# Patient Record
Sex: Female | Born: 1970 | Race: White | Hispanic: No | Marital: Married | Smoking: Never smoker
Health system: Southern US, Community
[De-identification: ages and names within clinical notes are randomized; demographics above are authoritative.]

## PROBLEM LIST (undated history)

## (undated) HISTORY — PX: CHOLECYSTECTOMY: SHX55

---

## 1997-12-30 ENCOUNTER — Other Ambulatory Visit: Admission: RE | Admit: 1997-12-30 | Discharge: 1997-12-30 | Payer: Self-pay | Admitting: Gynecology

## 1998-01-09 ENCOUNTER — Other Ambulatory Visit: Admission: RE | Admit: 1998-01-09 | Discharge: 1998-01-09 | Payer: Self-pay | Admitting: Gynecology

## 1998-01-22 ENCOUNTER — Encounter: Admission: RE | Admit: 1998-01-22 | Discharge: 1998-04-22 | Payer: Self-pay | Admitting: Gynecology

## 1998-06-21 ENCOUNTER — Inpatient Hospital Stay (HOSPITAL_COMMUNITY): Admission: AD | Admit: 1998-06-21 | Discharge: 1998-06-25 | Payer: Self-pay | Admitting: Gynecology

## 1998-07-04 ENCOUNTER — Inpatient Hospital Stay (HOSPITAL_COMMUNITY): Admission: AD | Admit: 1998-07-04 | Discharge: 1998-07-07 | Payer: Self-pay | Admitting: Gynecology

## 1998-07-07 ENCOUNTER — Encounter (HOSPITAL_COMMUNITY): Admission: RE | Admit: 1998-07-07 | Discharge: 1998-10-05 | Payer: Self-pay | Admitting: Gynecology

## 1999-07-03 ENCOUNTER — Emergency Department (HOSPITAL_COMMUNITY): Admission: EM | Admit: 1999-07-03 | Discharge: 1999-07-03 | Payer: Self-pay | Admitting: Emergency Medicine

## 1999-08-27 ENCOUNTER — Other Ambulatory Visit: Admission: RE | Admit: 1999-08-27 | Discharge: 1999-08-27 | Payer: Self-pay | Admitting: Gynecology

## 2000-11-01 ENCOUNTER — Other Ambulatory Visit: Admission: RE | Admit: 2000-11-01 | Discharge: 2000-11-01 | Payer: Self-pay | Admitting: Gynecology

## 2001-08-09 ENCOUNTER — Encounter: Payer: Self-pay | Admitting: Emergency Medicine

## 2001-08-09 ENCOUNTER — Emergency Department (HOSPITAL_COMMUNITY): Admission: EM | Admit: 2001-08-09 | Discharge: 2001-08-09 | Payer: Self-pay | Admitting: Emergency Medicine

## 2001-08-10 ENCOUNTER — Encounter (HOSPITAL_BASED_OUTPATIENT_CLINIC_OR_DEPARTMENT_OTHER): Payer: Self-pay | Admitting: General Surgery

## 2001-08-10 ENCOUNTER — Inpatient Hospital Stay (HOSPITAL_COMMUNITY): Admission: EM | Admit: 2001-08-10 | Discharge: 2001-08-11 | Payer: Self-pay | Admitting: Emergency Medicine

## 2001-08-10 ENCOUNTER — Encounter (INDEPENDENT_AMBULATORY_CARE_PROVIDER_SITE_OTHER): Payer: Self-pay | Admitting: Specialist

## 2001-08-11 ENCOUNTER — Encounter (HOSPITAL_BASED_OUTPATIENT_CLINIC_OR_DEPARTMENT_OTHER): Payer: Self-pay | Admitting: General Surgery

## 2001-11-16 ENCOUNTER — Other Ambulatory Visit: Admission: RE | Admit: 2001-11-16 | Discharge: 2001-11-16 | Payer: Self-pay | Admitting: Gynecology

## 2002-12-21 ENCOUNTER — Other Ambulatory Visit: Admission: RE | Admit: 2002-12-21 | Discharge: 2002-12-21 | Payer: Self-pay | Admitting: Gynecology

## 2003-09-07 HISTORY — PX: TUBAL LIGATION: SHX77

## 2003-12-30 ENCOUNTER — Other Ambulatory Visit: Admission: RE | Admit: 2003-12-30 | Discharge: 2003-12-30 | Payer: Self-pay | Admitting: Gynecology

## 2004-04-10 ENCOUNTER — Encounter: Admission: RE | Admit: 2004-04-10 | Discharge: 2004-04-10 | Payer: Self-pay | Admitting: Gynecology

## 2004-06-19 ENCOUNTER — Inpatient Hospital Stay (HOSPITAL_COMMUNITY): Admission: AD | Admit: 2004-06-19 | Discharge: 2004-06-22 | Payer: Self-pay | Admitting: Gynecology

## 2004-06-23 ENCOUNTER — Encounter (INDEPENDENT_AMBULATORY_CARE_PROVIDER_SITE_OTHER): Payer: Self-pay | Admitting: Specialist

## 2004-06-23 ENCOUNTER — Inpatient Hospital Stay (HOSPITAL_COMMUNITY): Admission: AD | Admit: 2004-06-23 | Discharge: 2004-06-26 | Payer: Self-pay | Admitting: Gynecology

## 2004-06-27 ENCOUNTER — Encounter: Admission: RE | Admit: 2004-06-27 | Discharge: 2004-07-27 | Payer: Self-pay | Admitting: Gynecology

## 2004-08-07 ENCOUNTER — Other Ambulatory Visit: Admission: RE | Admit: 2004-08-07 | Discharge: 2004-08-07 | Payer: Self-pay | Admitting: Gynecology

## 2005-08-18 ENCOUNTER — Other Ambulatory Visit: Admission: RE | Admit: 2005-08-18 | Discharge: 2005-08-18 | Payer: Self-pay | Admitting: Gynecology

## 2005-11-04 HISTORY — PX: NOVASURE ABLATION: SHX5394

## 2005-12-03 ENCOUNTER — Ambulatory Visit (HOSPITAL_COMMUNITY): Admission: RE | Admit: 2005-12-03 | Discharge: 2005-12-03 | Payer: Self-pay | Admitting: Gynecology

## 2005-12-03 ENCOUNTER — Encounter (INDEPENDENT_AMBULATORY_CARE_PROVIDER_SITE_OTHER): Payer: Self-pay | Admitting: Specialist

## 2006-09-01 ENCOUNTER — Other Ambulatory Visit: Admission: RE | Admit: 2006-09-01 | Discharge: 2006-09-01 | Payer: Self-pay | Admitting: Gynecology

## 2007-09-04 ENCOUNTER — Other Ambulatory Visit: Admission: RE | Admit: 2007-09-04 | Discharge: 2007-09-04 | Payer: Self-pay | Admitting: Gynecology

## 2008-06-06 ENCOUNTER — Ambulatory Visit: Payer: Self-pay | Admitting: Gynecology

## 2008-11-27 ENCOUNTER — Ambulatory Visit: Payer: Self-pay | Admitting: Gynecology

## 2009-02-20 ENCOUNTER — Ambulatory Visit: Payer: Self-pay | Admitting: Gynecology

## 2009-02-20 ENCOUNTER — Encounter: Payer: Self-pay | Admitting: Gynecology

## 2009-02-20 ENCOUNTER — Other Ambulatory Visit: Admission: RE | Admit: 2009-02-20 | Discharge: 2009-02-20 | Payer: Self-pay | Admitting: Gynecology

## 2009-08-18 ENCOUNTER — Ambulatory Visit: Payer: Self-pay | Admitting: Gynecology

## 2010-03-03 ENCOUNTER — Ambulatory Visit: Payer: Self-pay | Admitting: Gynecology

## 2010-03-03 ENCOUNTER — Other Ambulatory Visit: Admission: RE | Admit: 2010-03-03 | Discharge: 2010-03-03 | Payer: Self-pay | Admitting: Gynecology

## 2011-01-22 NOTE — Discharge Summary (Signed)
Rachel, Miller            ACCOUNT NO.:  [id number]   MEDICAL RECORD NO.:  [id number]          PATIENT TYPE:  INP   LOCATION:  9157                          FACILITY:  [facility]   PHYSICIAN:  Juan H. Lily Peer, M.D.DATE OF BIRTH:  09/07/1970   DATE OF ADMISSION:  06/19/2004  DATE OF DISCHARGE:                                 DISCHARGE SUMMARY   Days hospitalized:  3.   HISTORY:  The patient is a 40 year old gravida 2 para 1 who was admitted to  the hospital at [redacted] weeks gestation complaining of nausea and vomiting and  some right upper quadrant pain, and some swelling.  The patient was admitted  with the intentions of administering betamethasone for fetal lung maturity  and in the event of deteriorating condition to proceed with delivery after  24 hours.  Her blood pressure was 152/93 on admission and repeat 141/83,  152/84, 149/81, 162/85, and she was afebrile.  Her admitting labs had  demonstrated white blood count of 14.8, hemoglobin 13.8, hematocrit 39.8.  Comprehensive metabolic panel:  The SGOT had risen to 71, SGPT 77, uric acid  was normal, and LDH was normal as well.  The patient had received 2 doses of  betamethasone 12.5 mg IM, and an ultrasound had demonstrated a fetus in the  frank breech presentation and size was consistent with the dates with an AFI  in the normal range for [redacted] weeks gestation.  The placenta was intact and  cervix was long at 3.4 cm.  On June 21, 2004 the patient's weight had  dropped from 194 to 190.  Her blood pressure was 120/78.  She no longer  complained of any right upper quadrant pain as when she came in, or any  nausea or vomiting.  Her PIH panel demonstrated a platelet count of 243,000.  SGPT had returned from 77 down to 49, and SGOT was normal, uric acid was  normal, LDH was also normal.  The patient was monitored 1 more day in the  hospital and she continued to be normotensive with no right upper quadrant  pain or nausea/vomiting.  Her  weight was stable as on admission 194.  Fetal  heart rate tracing was reassuring with the exception of one isolated  variable deceleration at 4 a.m. but overall reassuring.  On exam, the  patient's abdomen was soft, nontender, no rebound or guarding.  Her lungs  were clear to auscultation.  Heart was regular rate and rhythm, no murmurs  or gallops.  Extremities no edema, DTRs 1+, and negative clonus.  The  patient was ready to be discharged home and to follow up in the office twice  weekly for antepartum testing.   FINAL DIAGNOSES:  1.  Labile pregnancy-induced hypertension at 34 and three-sevenths weeks      gestation, undelivered.  2.  Gestational diabetic, diet controlled.   PROCEDURES PERFORMED:  1.  Fetal monitoring.  2.  Fetal ultrasonography.  3.  Steroid administration (betamethasone 12.5 mg x2 24 hours apart) for      fetal lung maturity.   FINAL DISPOSITION AND FOLLOW-UP:  Rachel Miller will be discharged  home today.  She will be followed up in the office twice a week for antepartum testing  consisting of NSTs and AFIs and blood pressure readings.  The patient was  asked to keep a blood pressure log at home where  her husband will be taking  her blood pressure two to three times a day and bringing those values to the  office, and ranges were presented to her in which to contact the office.  Also to do fetal movement kick counts.  In the event the patient is to start  to gain weight, swelling, or symptoms to reoccur of right upper quadrant  pain or headaches or blurry vision, that her labile PIH may turn into full-  blown preeclampsia and at that point we will ultrasound her and if she is  still breech, will proceed with a  cesarean section.  If not, will consider induction.  For now, we will wait  and try to get her at least to [redacted] weeks gestation since she is a gestational  diabetic, diet controlled.  All this was discussed with Rachel Miller and her  husband, all questions were  answered, and will follow accordingly.      JHF/MEDQ  D:  06/22/2004  T:  06/22/2004  Job:  [id number]

## 2011-01-22 NOTE — Op Note (Signed)
[facility]  Patient:    Rachel Miller, Rachel Miller Visit Number: [id number] MRN: [id number]          Service Type: SUR Location: 4W [id number] Attending Physician:  Sonda Primes Dictated by:   Mardene Celeste. Lurene Shadow, M.D. Proc. Date: 08/10/01 Admit Date:  08/10/2001 Discharge Date: 08/11/2001   CC:         Luisa Hart L. Lurene Shadow, M.D., two copies   Operative Report  PREOPERATIVE DIAGNOSIS:  Acute cholecystitis.  POSTOPERATIVE DIAGNOSIS:  Acute cholecystitis.  PROCEDURE:  Laparoscopic cholecystectomy with intraoperative cholangiogram.  SURGEON:  Luisa Hart L. Lurene Shadow, M.D.  ASSISTANT:  Marnee Spring. Wiliam Ke, M.D.  ANESTHESIA:  General.  INDICATIONS:  The patient is a 40 year old woman presenting with a several-day history of worsening right upper quadrant and epigastric pain, nausea, and vomiting. It prompted an ER visit last night where she was diagnosed to have cholelithiasis. She was afebrile and without leukocytosis at that time. However, on return to the ER today because of increasing pain, she has a noted left shift and increasing higher leukocytosis to 14,000. On examination she is exquisitely tender. She is brought to the operating room after the risks and potential benefits of laparoscopic cholecystectomy had been fully discussed with her and she accepts those risks, and gives permission.  DESCRIPTION OF PROCEDURE:  Following the induction of satisfactory anesthesia with the patient positioned supinely, the abdomen was routinely prepped and draped to be included in the sterile operative field. Open laparoscopy was created at the umbilicus with insertion of the Hasson cannula and insufflation of the peritoneal cavity to 14 mmHg pressure. Visual exploration of the abdomen showed no other abnormality except for a tensely hydropic and inflamed gallbladder. Under direct vision, the epigastric and lateral ports were placed and the gallbladder was held and  aspirating suction was placed into the gallbladder. Some of the gallbladder contents were sucked out, however, because of the thick bile within the gallbladder, this could not be completely evacuated. The gallbladder was then grasped and retracted cephalad. _______ was carried down to the region of the ampulla with isolation and visualization of the cystic duct, tracing it up into its cystic duct-gallbladder junction. This was then isolated proximally and opened. The cystic duct cholangiogram was carried out. The resultant cholangiogram showed free flow of contrast into the duodenum with no filling defects, and a normal biliary caliber. The cholangiocathter was removed, the cystic duct was doubly clipped and transected, the dissection carried up within the triangle of Calots so that the cystic artery could be isolated. This was doubly clipped and transected. A very thick peel around the gallbladder was then tediously dissected off so as to remove the gallbladder, maintaining hemostasis throughout the course of the dissection. At the end of the dissection, the liver bed was again thoroughly inspected. Additional bleeding points were treated with electrocautery. The right upper quadrant was then thoroughly irrigated with multiple aliquots of normal saline. The camera was then moved to the epigastric port of the gallbladder and placed in a laparoscopic pouch and retrieved through the umbilicus. The right upper quadrant was then again thoroughly irrigated and aspirated. Sponge, instruments, and sharp counts were verified, trocars were removed under direct vision and the wounds closed in layers as follows: The umbilical wound in two layers with 0 Dexon and 4-0 Dexon; the epigastric and lateral ports were closed with 4-0 Dexon sutures. All wounds were then reinforced with Steri-Strips and sterile dressings were applied. The anesthetic was reversed and the patient removed  from the operating room to  the recovery room in stable condition. She tolerated the procedure well. Dictated by:   Mardene Celeste. Lurene Shadow, M.D. Attending Physician:  Sonda Primes DD:  08/10/01 TD:  08/10/01 Job: [id number]

## 2011-01-22 NOTE — Discharge Summary (Signed)
NAMEARIANN, Rachel Miller            ACCOUNT NO.:  [id number]   MEDICAL RECORD NO.:  [id number]          PATIENT TYPE:  INP   LOCATION:  9109                          FACILITY:  [facility]   PHYSICIAN:  Timothy P. Fontaine, M.D.DATE OF BIRTH:  September 11, 1970   DATE OF ADMISSION:  06/23/2004  DATE OF DISCHARGE:                                 DISCHARGE SUMMARY   DISCHARGE DIAGNOSES:  1.  Pregnancy at 34 weeks.  2.  Breech presentation.  3.  Hemolysis, elevated liver function, and low platelets syndrome.  4.  Desires permanent sterilization.   PROCEDURE:  June 23, 2004 - primary low transverse cesarean section,  bilateral tubal sterilization.  Pathology [phone]:  1. Placenta:  Parenchymal infarct.  2. Fallopian tube segments:  Two segments, complete  transection, no pathologic abnormalities.   HOSPITAL COURSE:  A 40 year old G2 P1 female at 37 weeks admitted with right  upper quadrant pain, nausea, vomiting, elevated liver functions, with the  diagnosis of HELLP syndrome.  She was found to be in breech presentation and  underwent a primary cesarean section, delivering a normal female infant,  Apgars 8 and 9, weight 4 pounds 6 ounces.  Pelvic anatomy was noted to be  normal.  She underwent a bilateral tubal sterilization.  The patient's  postpartum course was uncomplicated.  She was placed on magnesium sulfate  prophylactically for 24 hours post delivery.  She did have elevations in her  SGOT/SGPT but on repeat the day of discharge these values were normalizing  and she had a normal platelet count.  She had no signs or symptoms; her  nausea, vomiting, and right upper quadrant pain had all resolved.  She was  given precautions, instructions, and follow-up.  Will be seen in the office  in 6 weeks and a prescription for Tylox #20 one to two p.o. q.4-6h. p.r.n.  pain.  Her blood type is O positive and her rubella titer was positive.     Timo   TPF/MEDQ  D:  06/26/2004  T:  06/26/2004  Job:   [id number]

## 2011-01-22 NOTE — H&P (Signed)
NAMEDELESA, KAWA            ACCOUNT NO.:  [id number]   MEDICAL RECORD NO.:  [id number]          PATIENT TYPE:  AMB   LOCATION:  SDC                           [facility]ACILITY:  [facility]   PHYSICIAN:  Timothy P. Fontaine, M.D.DATE OF BIRTH:  1970/09/12   DATE OF ADMISSION:  12/03/2005  DATE OF DISCHARGE:                                HISTORY & PHYSICAL   CHIEF COMPLAINT:  Menorrhagia.   HISTORY OF PRESENT ILLNESS:  A 40 year old, G2, P2 female with a history of  long term menorrhagia which is becoming unacceptable to her.  Outpatient  evaluation included a normal CBC with a hemoglobin of 14, normal TSH, and a  negative sonohysterogram.  She is admitted at this time for endometrial  ablation with the NovaSure technique.   PAST MEDICAL HISTORY:  Uncomplicated.   PAST SURGICAL HISTORY:  1.  Cholecystectomy.  2.  Tubal sterilization with cesarean section.   ALLERGIES:  No medications.   REVIEW OF SYSTEMS:  Noncontributory.   FAMILY HISTORY:  Noncontributory.   SOCIAL HISTORY:  Noncontributory.   ADMISSION PHYSICAL EXAMINATION:  VITAL SIGNS:  Afebrile, vital signs are  stable.  HEENT:  Normal.  LUNGS:  Clear.  CARDIAC:  Regular rate.  No rubs, murmurs, or gallops.  ABDOMEN:  Benign.  PELVIC:  External BUS, vagina normal.  Cervix normal.  Uterus normal size,  midline, and mobile.  Adnexa without masses or tenderness.   ASSESSMENT:  A 40 year old, gravida 2, para 2 female status post tubal  sterilization increasing menorrhagia which is socially unacceptable to her.  She has a normal sonohysterogram, normal thyroid study.   I have reviewed with her options for management to include endometrial  ablation.  She has read over the information and has reviewed the NovaSure  technique on-line and wants to proceed with endometrial ablation.  She  understands there are no guarantees as far as menorrhagia relief.  The  bleeding may improve but may not and she understands and accepts this.   She  understands it is machine dependent if there is a malfunction with the  equipment that we may not be able to do her ablation at that time.  She also  understands that she will need long term followup as far as routine  gynecologic care with Pap smears, endometrial carcinoma surveillance, all of  which she understands and accepts.  She also understands, although she is  status post BTL that she should never achieve pregnancy in the future, that  this could be dangerous and she understands this.  I reviewed what is  involved with the procedure, the instrumentation, expected  intraoperative/postoperative courses, the risks of infection, prolonged  antibiotics, hemorrhage necessitating transfusion, and the risk of  transfusion, the risk of uterine perforation, inadvertent injury to internal  organs including bowel, bladder, ureters, vessels, and nerves both directly  or thermally with the NovaSure instrument leading  to major reparative  surgeries and future reparative surgeries including ostomy formation, all of  which was understood and accepted.  The patient's questions were answered to  her satisfaction.  She is ready to proceed with surgery.  Timothy P. Fontaine, M.D.  Electronically Signed     TPF/MEDQ  D:  11/30/2005  T:  11/30/2005  Job:  [id number]

## 2011-01-22 NOTE — Op Note (Signed)
Rachel Miller, Rachel Miller            ACCOUNT NO.:  [id number]   MEDICAL RECORD NO.:  [id number]          PATIENT TYPE:  INP   LOCATION:  9371                          FACILITY:  [facility]   PHYSICIAN:  Timothy P. Fontaine, M.D.DATE OF BIRTH:  08/07/1971   DATE OF PROCEDURE:  06/23/2004  DATE OF DISCHARGE:                                 OPERATIVE REPORT   PREOPERATIVE DIAGNOSES:  Pregnancy at 34 weeks breech presentation, HELLP  syndrome, desires permanent sterilization, gestational diabetes.   POSTOPERATIVE DIAGNOSES:  Pregnancy at 34 weeks breech presentation, HELLP  syndrome, desires permanent sterilization, gestational diabetes.   PROCEDURE:  Primary low transverse cervical cesarean section, bilateral  tubal sterilization, modified Pomeroy technique.   SURGEON:  Timothy P. Fontaine, M.D.   ASSISTANT:  Ivor Costa. Farrel Gobble, M.D.   ANESTHESIA:  Spinal.   ESTIMATED BLOOD LOSS:  Less than 500 mL.   COMPLICATIONS:  None.   SPECIMENS:  Samples of cord blood, placenta, portions of right and portions  of left fallopian tubes. At 13:19 normal female, Apgar's 8 and 9, weight 4  pounds 6 ounces, pelvic anatomy noted to be normal. Infant found in the  frank breech presentation.   DESCRIPTION OF PROCEDURE:  The patient was taken to the operating room,  underwent spinal anesthesia and was placed in left tilt supine position,  received an abdominal preparation with Betadine solution. The bladder was  emptied with in and out indwelling Foley catheterization, placed in sterile  technique per nursing personnel. The patient was draped in the usual fashion  and after assuring adequate anesthesia the abdomen was  sharply entered  through a Pfannenstiel incision achieving adequate hemostasis at all levels.  The bladder flap was sharply and bluntly developed without difficulty. The  uterus was sharply entered and the lower uterine segment bluntly extended  laterally. The bulging membranes were ruptured,  fluid noted to be clear. The  infant was found to be in the frank breech presentation, was converted to a  footling-breech and underwent a breech extraction. The nares and mouth were  suctioned, the cord doubly clamped and cut and the infant was handed to  pediatric's in attendance. Samples of cord blood were obtained, placenta was  then spontaneously extruded, noted to be intact and was sent to pathology.  The uterus was exteriorized, endometrial cavity explored with the sponge to  remove all placental membrane fragments. The patient received 1 g Ancef  antibiotic prophylaxis at this time. The uterine incision was closed using  #0 Vicryl suture in a running interlocking stitch. Several figure-of-eight  interrupted sutures were subsequently placed to achieve ultimate hemostasis.  The left fallopian tube was then identified, traced from its insertion to  its fimbriated end, mid tubal segment grasped, elevated and doubly ligated  using #0 plain suture. The intervening segment was excised and a similar  procedure was carried out on the other side. The uterus was returned to the  abdomen which was copiously irrigated, both tubal sites reinspected showing  adequate hemostasis and the anterior fascia reapproximated using #0 Vicryl  suture in a running stitch starting at the angle and meeting  in the middle.  The subcutaneous tissues were irrigated, adequate hemostasis achieved with  electrocautery, skin reapproximated using 4-0 Vicryl in a running  subcuticular stitch.  Benzoin and Steri-Strips applied, sterile dressing  applied, patient taken to the recovery room in good condition having  tolerated the procedure well.     Timo   TPF/MEDQ  D:  06/23/2004  T:  06/23/2004  Job:  [id number]

## 2011-01-22 NOTE — H&P (Signed)
Rachel Miller, CRYDER            ACCOUNT NO.:  [id number]   MEDICAL RECORD NO.:  [id number]          PATIENT TYPE:  MAT   LOCATION:  MATC                          FACILITY:  [facility]   PHYSICIAN:  Timothy P. Fontaine, M.D.DATE OF BIRTH:  12-02-1970   DATE OF ADMISSION:  06/23/2004  DATE OF DISCHARGE:                                HISTORY & PHYSICAL   CHIEF COMPLAINT:  Pregnancy at 34 weeks, HELLP syndrome.   HISTORY OF PRESENT ILLNESS:  A 40 year old G 2, P 1 female at 66 weeks,  history of PIH, gestational diabetes, evaluated 72 hours ago complaining of  nausea, vomiting.  The patient was admitted, found to have elevated SGOT,  SGPT, liver function studies with right upper quadrant pain, nausea and  vomiting.  She was treated conservatively initially to allow for steroid  administration for fetal lung maturity.  She received 2 doses of steroids  and on repeat evaluation she became asymptomatic and her liver function  studies normalized.  The patient was discharged yesterday.  Subsequently,  last evening began with nausea, vomiting, increasing right upper quadrant  pain and represents for evaluation.  For the remainder of her history, see  her [facility].   PHYSICAL EXAMINATION:  VITAL SIGNS:  Blood pressure 156/67, vital signs are  stable.  HEENT:  Normal.  LUNGS:  Clear.  CARDIAC:  A regular rate.  No rubs, murmurs, or gallops.  ABDOMEN:  On abdominal exam, breech on Leopold's, right upper quadrant  tenderness, no rebound or acute changes.  FETAL MONITORING:  External monitor is without contractions, reassuring  fetal tracing.  PELVIC:  Closed, high, presenting part not palpable.  EXTREMITIES:  DTRs 3+, clonus one beat.   ASSESSMENT AND PLAN:  Thirty-four weeks gestation, pregnancy-induced  hypertension, gestational diabetes, recent admission with steroid  administration with nausea, vomiting, right upper quadrant pain consistent  with hemolysis, elevated liver enzymes, and low  platelet count syndrome.  Check baseline PIH labs, discuss situation with the patient and her husband.  Feel the most prudent course is to proceed with cesarean delivery given her  total picture and breech presentation.  I did review with her that I could  verify breech on ultrasound as it was verified 3 days ago but even if found  in vertex presentation I would proceed with cesarean delivery given the  significance of her symptoms and the probably long stage induction that  would be required to achieve a vaginal delivery.  Will hold on magnesium  predelivery but plan on magnesium post delivery for 24 hours at least to  reassess at that time for status.  I reviewed with them what is involved  with cesarean delivery, intraoperative/postoperative courses, the risks to  include bleeding, transfusion, infection, prolonged antibiotics, wound  complications, open and draining of incisions, closure by secondary  intention.  The risks of inadvertent injury to internal organs including  bowel, bladder, ureters, vessels, and nerves necessitating measures  exploratory reparative surgeries and future reparative  surgeries including  ostomy formation was reviewed with them.  The risk of fetal injury to  include musculoskeletal and neuroskeletal were reviewed as  well as the  issues of prematurity.  Lastly, the patient does want tubal sterilization.  She has thought about this prenatally.  I discussed with her the issues of  prematurity in the infant and whether they wanted to wait after delivery as  far as fetal status but she feels very strongly that she wants to proceed  with tubal sterilization regardless of ultimate fetal outcome.  The  permanency of the procedure as well as the risks of  failure were reviewed with her.  She understands and accepts this and wants  to proceed with surgery.  We will go ahead and draw her baseline PIH labs  now to determine their values and then ultimately proceed with  cesarean  section today.      TPF/MEDQ  D:  06/23/2004  T:  06/23/2004  Job:  [id number]

## 2011-01-22 NOTE — H&P (Signed)
Rachel Miller, Rachel Miller            ACCOUNT NO.:  [id number]   MEDICAL RECORD NO.:  [id number]          PATIENT TYPE:  MAT   LOCATION:  [facility]                          FACILITY:  [facility]   PHYSICIAN:  Rachel Miller, M.D.DATE OF BIRTH:  1971/03/31   DATE OF ADMISSION:  06/19/2004  DATE OF DISCHARGE:                                HISTORY & PHYSICAL   CHIEF COMPLAINT:  Right upper quadrant discomfort, along with nausea and  vomiting.   HISTORY:  The patient is a 40 year old, gravida 2, para 1, currently [redacted]  weeks gestation, estimated date of confinement August 02, 2004.  The  patient had been seen in the office today for a routine prenatal visit, and  was found to have PIH, and was to be followed as an outpatient, and with  antepartum testing and twice a week office visits.  She was doing well, but  at the end of the day, around 1700 hours, she had complained of some right  upper quadrant discomfort, some nausea, and had vomited several times, and  was asked to come to the hospital for further evaluation.  She had denied  any visual disturbances, or any unusual headaches.  Review of her records  indicated that she had had preterm premature rupture of membranes at 31  weeks with her last pregnancy.  She had clue cells, and was treated in the  second trimester of this pregnancy.  Earlier on, she had oligohydramnios  which eventually resolved, and she had developed gestational diabetes, which  has been followed regularly, and she was on diet control.  On arrival to the  hospital, her blood pressure was followed at 152/93, on repeat 141/83,  152/84, 149/81, 162/85, and her temperature was 98.5, and heart rate ranged  in the high 70s to mid 80s.   PAST MEDICAL HISTORY:  She denies any allergies.  Prior pregnancy was  delivered vaginally.  She had premature rupture of membranes at [redacted] weeks  gestation, and delivered at 33.[redacted] weeks gestation.  Past history of PIH, and  a history of bacterial  vaginosis with this pregnancy, and gestational  diabetes, diet controlled.  Oligohydramnios earlier in pregnancy, which  resolved.  See hospital form for additional past medical history, as well as  review of systems.   PHYSICAL EXAMINATION:  VITAL SIGNS:  Described above.  GENERAL:  The patient was alert, oriented, in no apparent acute distress.  Subjectively, she has __________.  She has had no right upper quadrant  discomfort.  HEENT:  Unremarkable.  NECK:  Supple.  Trachea midline.  No carotid bruits, no thyromegaly.  LUNGS:  Clear to auscultation without any rhonchi or wheezes.  HEART:  Regular rate and rhythm.  No murmurs or gallops.  BREAST EXAM:  Not done.  ABDOMEN:  Gravid uterus.  Breech presentation by [facility] maneuver.  PELVIC EXAM:  Not done.   LABORATORY DATA:  The patient underwent an ultrasound which demonstrated a  viable fetus in the frank breech presentation.  Amniotic fluid was in the  normal range.  Placenta was a grade 2 and anterior.  Estimated fetal weight  was  in the 75th to 90th percentile (2416 to 2687 gm).  The cervix was closed  at 3.4 cm.  PIH labs were as follows - SGOT and SGPT slightly elevated at 60  and 58 respectively.  Total bilirubin, BUN, and creatinine were normal, and  uric acid and LDH were in normal range.  CBC revealed a white count of  17,000, hemoglobin and hematocrit 14.5 and 41.2 respectively, and a platelet  count of 225,000.  Urine proteinuria trace was noted at 30 mg/dl.   ASSESSMENT:  A 40 year old, gravida 2, para 1, at [redacted] weeks gestation with  pregnancy-induced hypertension, stable.  Will attempt to get 2 doses of  betamethasone 12.5 mg IM, repeated in 24 hours, in an effort to enhance  fetal lung maturity.  Will repeat pregnancy-induced hypertension labs in 8  hours and see if there is a Trendelenburg, and continue to monitor her blood  pressures.  I have explained to [place] and her husband that if there is  any sign of  deterioration (i.e. meaning her liver function tests continue to  elevate, or if she starts having right upper quadrant pain with vomiting, or  any abnormality on the fetal heart rate tracing, or if her blood pressure  continues to be elevated), that we may not have the luxury of waiting 24  hours to get the second dose of the betamethasone, and we may need to  proceed with a cesarean section, due to the fact that the baby is in the  frank breech presentation.  They fully agree.  They have also voiced that  they would like to proceed with a bilateral tubal sterilization at the time  of cesarean section.  This had been discussed prenatally, as well.  They are  fully aware of all the above risks, benefits, and pros and cons, and all  questions were answered.  Will following according to plan as per assessment  above.      JHF/MEDQ  D:  06/20/2004  T:  06/20/2004  Job:  [id number]

## 2011-01-22 NOTE — Op Note (Signed)
NAMEFRANCIS, Rachel Miller            ACCOUNT NO.:  [id number]   MEDICAL RECORD NO.:  [id number]          PATIENT TYPE:  AMB   LOCATION:  [facility]                           FACILITY:  [facility]   PHYSICIAN:  Timothy P. Fontaine, M.D.DATE OF BIRTH:  18-May-1971   DATE OF PROCEDURE:  12/03/2005  DATE OF DISCHARGE:                                 OPERATIVE REPORT   PREOPERATIVE DIAGNOSES:  Menorrhagia.   POSTOPERATIVE DIAGNOSES:  Menorrhagia.   PROCEDURE:  Hysteroscopy, D&C, NovaSure endometrial ablation.   SURGEON:  Dr. Audie Box.   ANESTHETIC:  MAC with 1% lidocaine paracervical block.   SPECIMEN:  Endometrial curetting.   COMPLICATIONS:  None.   ESTIMATED BLOOD LOSS:  Minimal.   SORBITOL DISCREPANCY:  Reported at 0.   FINDINGS:  Uterus normal cavity, good distension, no evidence of  perforation. NovaSure settings; uterus 8.5 cm sound, cervical length 4.0,  cavity width 4, power setting 99, cautery length 1 minute 13 seconds.   PROCEDURE:  The patient was taken to the operating room, underwent MAC  anesthesia and was placed low dorsal lithotomy position, received a perineal  vaginal preparation with Betadine solution.  Bladder emptied with in-and-out  Foley catheterization per nursing personnel.  The patient was draped in  usual fashion.  EUA performed.  Cervix visualized with a speculum. Anterior  lip grasped with single-tooth tenaculum.  The uterus was sounded and  subsequent cervical length determined.  Uterus was then gently dilated to  admit the sharp curette and a gentle sharp curettage was performed.  Subsequently, the NovaSure device was placed within the cavity. Manipulation  assured proper placement. The carbon dioxide test was performed and passed  and subsequently the NovaSure endometrial ablation performed without  difficulty.  Settings noted above.  Instrument was then removed.  Hysteroscopy performed and showed an empty cavity, good distension, no  evidence of perforation  with a good even cauterization throughout the  cavity.  Instruments were removed.  A small arterial pumping bleeding was  noted at the right tenaculum site.  The 3-0 chromic suture interrupted was  placed to achieve hemostasis.  The speculum was then  removed.  The patient placed in supine position, awakened without difficulty  and taken to recovery room in good condition having tolerated procedure  well.  Of note, the paracervical block using 1% lidocaine was placed  following tenaculum placement.  A total of 10 mL.      Timothy P. Fontaine, M.D.  Electronically Signed     TPF/MEDQ  D:  12/03/2005  T:  12/06/2005  Job:  [id number]

## 2011-03-15 ENCOUNTER — Encounter (INDEPENDENT_AMBULATORY_CARE_PROVIDER_SITE_OTHER): Payer: 59 | Admitting: Gynecology

## 2011-03-15 ENCOUNTER — Other Ambulatory Visit: Payer: Self-pay | Admitting: Gynecology

## 2011-03-15 ENCOUNTER — Other Ambulatory Visit (HOSPITAL_COMMUNITY)
Admission: RE | Admit: 2011-03-15 | Discharge: 2011-03-15 | Disposition: A | Discharge status: Home / Self Care | Payer: 59 | Source: Ambulatory Visit | Attending: Gynecology | Admitting: Gynecology

## 2011-03-15 DIAGNOSIS — Z01419 Encounter for gynecological examination (general) (routine) without abnormal findings: Secondary | ICD-10-CM

## 2011-03-15 DIAGNOSIS — Z124 Encounter for screening for malignant neoplasm of cervix: Secondary | ICD-10-CM | POA: Insufficient documentation

## 2011-03-15 DIAGNOSIS — Z833 Family history of diabetes mellitus: Secondary | ICD-10-CM

## 2011-03-15 DIAGNOSIS — Z13220 Encounter for screening for lipoid disorders: Secondary | ICD-10-CM

## 2011-03-19 ENCOUNTER — Other Ambulatory Visit: Payer: 59

## 2011-03-19 ENCOUNTER — Ambulatory Visit (INDEPENDENT_AMBULATORY_CARE_PROVIDER_SITE_OTHER): Payer: 59 | Admitting: Gynecology

## 2011-03-19 DIAGNOSIS — N912 Amenorrhea, unspecified: Secondary | ICD-10-CM

## 2011-03-19 DIAGNOSIS — N949 Unspecified condition associated with female genital organs and menstrual cycle: Secondary | ICD-10-CM

## 2011-06-21 ENCOUNTER — Ambulatory Visit: Payer: 59 | Admitting: Gynecology

## 2011-06-21 ENCOUNTER — Other Ambulatory Visit: Payer: 59

## 2011-06-21 ENCOUNTER — Ambulatory Visit (INDEPENDENT_AMBULATORY_CARE_PROVIDER_SITE_OTHER): Payer: 59

## 2011-06-21 ENCOUNTER — Encounter: Payer: Self-pay | Admitting: Gynecology

## 2011-06-21 ENCOUNTER — Ambulatory Visit (INDEPENDENT_AMBULATORY_CARE_PROVIDER_SITE_OTHER): Payer: 59 | Admitting: Gynecology

## 2011-06-21 ENCOUNTER — Other Ambulatory Visit: Payer: Self-pay | Admitting: Gynecology

## 2011-06-21 DIAGNOSIS — N854 Malposition of uterus: Secondary | ICD-10-CM

## 2011-06-21 DIAGNOSIS — R102 Pelvic and perineal pain: Secondary | ICD-10-CM

## 2011-06-21 DIAGNOSIS — D251 Intramural leiomyoma of uterus: Secondary | ICD-10-CM

## 2011-06-21 DIAGNOSIS — N859 Noninflammatory disorder of uterus, unspecified: Secondary | ICD-10-CM

## 2011-06-21 DIAGNOSIS — N8300 Follicular cyst of ovary, unspecified side: Secondary | ICD-10-CM

## 2011-06-21 DIAGNOSIS — D259 Leiomyoma of uterus, unspecified: Secondary | ICD-10-CM

## 2011-06-21 DIAGNOSIS — N949 Unspecified condition associated with female genital organs and menstrual cycle: Secondary | ICD-10-CM

## 2011-06-21 NOTE — Progress Notes (Signed)
Patient presents for follow ultrasound. She has a history of monthly pelvic cramping status post endometrial ablation. She is has no menses or vaginal bleeding. She had a cystic area in the upper uterus near the fundus in July questionable as to whether this was in the cavity or in the myometrium and we've recommended a follow up ultrasound.  Ultrasound today shows several small myomas the largest measuring 44 mm with some areas of cystic degeneration. Right and left ovaries are visualized with physiologic changes.  Pelvic cramping status post ablation ultrasound overalls normal with several small myomas questional cystic degeneration. Options for management were reviewed to include expectant management versus interventional such as hysterectomy. The patient's comfortable with observation center pain really is not that bad to prefer just watch for now she'll follow up in July which is due for her annual sooner as needed.

## 2011-06-22 ENCOUNTER — Encounter: Payer: Self-pay | Admitting: Gynecology

## 2011-06-22 ENCOUNTER — Other Ambulatory Visit: Payer: Self-pay | Admitting: *Deleted

## 2011-06-22 DIAGNOSIS — Z803 Family history of malignant neoplasm of breast: Secondary | ICD-10-CM

## 2011-06-24 ENCOUNTER — Other Ambulatory Visit: Payer: Self-pay | Admitting: Gynecology

## 2011-06-24 DIAGNOSIS — Z803 Family history of malignant neoplasm of breast: Secondary | ICD-10-CM

## 2012-03-01 ENCOUNTER — Other Ambulatory Visit: Payer: Self-pay | Admitting: Gynecology

## 2012-03-01 DIAGNOSIS — D259 Leiomyoma of uterus, unspecified: Secondary | ICD-10-CM

## 2012-03-01 DIAGNOSIS — D251 Intramural leiomyoma of uterus: Secondary | ICD-10-CM

## 2012-03-15 ENCOUNTER — Ambulatory Visit: Payer: 59

## 2012-03-15 ENCOUNTER — Encounter: Payer: 59 | Admitting: Gynecology

## 2012-03-15 ENCOUNTER — Encounter: Payer: Self-pay | Admitting: Gynecology

## 2012-03-15 ENCOUNTER — Ambulatory Visit (INDEPENDENT_AMBULATORY_CARE_PROVIDER_SITE_OTHER): Payer: 59 | Admitting: Gynecology

## 2012-03-15 VITALS — BP 114/70 | Ht 62.5 in | Wt 145.0 lb

## 2012-03-15 DIAGNOSIS — Z01419 Encounter for gynecological examination (general) (routine) without abnormal findings: Secondary | ICD-10-CM

## 2012-03-15 NOTE — Patient Instructions (Signed)
Follow up in one year for annual gynecologic exam. 

## 2012-03-15 NOTE — Progress Notes (Signed)
Rachel Miller 1971-01-18 [id number]        41 y.o.  for annual exam.  Doing well.  Past medical history,surgical history, medications, allergies, family history and social history were all reviewed and documented in the EPIC chart. ROS:  Was performed and pertinent positives and negatives are included in the history.  Exam: Sherrilyn Rist assistant Blood pressure 114/70 General appearance  Normal Skin grossly normal Head/Neck normal with no cervical or supraclavicular adenopathy thyroid normal Lungs  clear Cardiac RR, without RMG Abdominal  soft, nontender, without masses, organomegaly or hernia Breasts  examined lying and sitting without masses, retractions, discharge or axillary adenopathy. Pelvic  Ext/BUS/vagina  normal   Cervix  normal   Uterus  anteverted, normal size, shape and contour, midline and mobile nontender   Adnexa  Without masses or tenderness    Anus and perineum  normal   Rectovaginal  normal sphincter tone without palpated masses or tenderness.    Assessment/Plan:  41 y.o. G2 P2 female for annual exam, tubal sterilization.    1. Status post endometrial ablation. Amenorrheic. Doing well we'll continue to monitor. 2. Mammography. Patient is due for mammogram in the fall and she knows to schedule this. SBE monthly reviewed. 3. Pap smear. Last Pap smear 2012. No Pap smear done today. I reviewed current screening guidelines we'll plan every 3-5 your screening. 4. Health maintenance. Patient had normal lipid profile, glucose, CBC and urinalysis 2012. No lab work was done today. Patient will follow up in one year assuming she continues well, sooner as needed.    Dara Lords MD, 11:22 AM 03/15/2012

## 2012-06-28 ENCOUNTER — Encounter: Payer: Self-pay | Admitting: Gynecology

## 2013-04-18 ENCOUNTER — Encounter: Payer: Self-pay | Admitting: Gynecology

## 2013-04-18 ENCOUNTER — Ambulatory Visit (INDEPENDENT_AMBULATORY_CARE_PROVIDER_SITE_OTHER): Payer: 59 | Admitting: Gynecology

## 2013-04-18 VITALS — BP 120/76 | Ht 63.0 in | Wt 151.0 lb

## 2013-04-18 DIAGNOSIS — Z01419 Encounter for gynecological examination (general) (routine) without abnormal findings: Secondary | ICD-10-CM

## 2013-04-18 DIAGNOSIS — Z13220 Encounter for screening for lipoid disorders: Secondary | ICD-10-CM

## 2013-04-18 NOTE — Patient Instructions (Signed)
Followup for annual exam in one year. Sooner if any issues. 

## 2013-04-18 NOTE — Progress Notes (Signed)
Rachel Miller 27-Apr-1971 [id number]        42 y.o.  G2P2 for annual exam.  Doing well without complaints.  Past medical history,surgical history, medications, allergies, family history and social history were all reviewed and documented in the EPIC chart.  ROS:  Performed and pertinent positives and negatives are included in the history, assessment and plan .  Exam: Kim assistant Filed Vitals:   04/18/13 1505  BP: 120/76  Height: 5\' 3"  (1.6 m)  Weight: 151 lb (68.493 kg)   General appearance  Normal Skin grossly normal Head/Neck normal with no cervical or supraclavicular adenopathy thyroid normal Lungs  clear Cardiac RR, without RMG Abdominal  soft, nontender, without masses, organomegaly or hernia Breasts  examined lying and sitting without masses, retractions, discharge or axillary adenopathy. She does have a small classic-appearing sebaceous cyst right areola at 3:00 position. Moves with the skin. Pelvic  Ext/BUS/vagina  normal   Cervix  normal   Uterus  retroverted, normal size, shape and contour, midline and mobile nontender   Adnexa  Without masses or tenderness    Anus and perineum  normal   Rectovaginal  normal sphincter tone without palpated masses or tenderness.    Assessment/Plan:  42 y.o. G2P2 female for annual exam, amenorrheic, tubal sterilization.   1. Amenorrhea. Patient remains amenorrheic since her NovaSure ablation 2007. Not having any symptoms to suggest early menopause. We'll continue to monitor. 2. Small classic-appearing sebaceous cyst right areola. Has been present for a long time. Options for management include observation versus excision discussed and she is comfortable with observation. She will report any changes. 3. Mammography 06/2012. Patient knows to repeat this coming October. SBE monthly reviewed. 4. Pap smear 2012. No Pap smear done today. No history of significant abnormal Pap smears. Plan repeat next year at 3 year interval. 5. Health  maintenance. Baseline CBC, comprehensive metabolic panel, lipid profile, urinalysis ordered. Followup one year, sooner as needed.  Note: This document was prepared with digital dictation and possible smart phrase technology. Any transcriptional errors that result from this process are unintentional.   Dara Lords MD, 3:29 PM 04/18/2013

## 2013-04-19 LAB — COMPREHENSIVE METABOLIC PANEL
AST: 14 U/L (ref 0–37)
Albumin: 5.2 g/dL (ref 3.5–5.2)
BUN: 14 mg/dL (ref 6–23)
CO2: 28 mEq/L (ref 19–32)
Calcium: 9.7 mg/dL (ref 8.4–10.5)
Chloride: 102 mEq/L (ref 96–112)
Creat: 0.71 mg/dL (ref 0.50–1.10)
Glucose, Bld: 87 mg/dL (ref 70–99)
Potassium: 4 mEq/L (ref 3.5–5.3)

## 2013-04-19 LAB — CBC WITH DIFFERENTIAL/PLATELET
Eosinophils Relative: 2 % (ref 0–5)
HCT: 39.9 % (ref 36.0–46.0)
Hemoglobin: 13.3 g/dL (ref 12.0–15.0)
Lymphocytes Relative: 28 % (ref 12–46)
Lymphs Abs: 2.5 10*3/uL (ref 0.7–4.0)
MCV: 91.3 fL (ref 78.0–100.0)
Monocytes Absolute: 0.7 10*3/uL (ref 0.1–1.0)
Monocytes Relative: 7 % (ref 3–12)
Neutro Abs: 5.6 10*3/uL (ref 1.7–7.7)
RDW: 13.7 % (ref 11.5–15.5)
WBC: 8.9 10*3/uL (ref 4.0–10.5)

## 2013-04-19 LAB — LIPID PANEL
Cholesterol: 130 mg/dL (ref 0–200)
HDL: 52 mg/dL (ref >39)
Total CHOL/HDL Ratio: 2.5 Ratio

## 2013-04-19 LAB — URINALYSIS W MICROSCOPIC + REFLEX CULTURE
Bilirubin Urine: NEGATIVE
Casts: NONE SEEN
Glucose, UA: NEGATIVE mg/dL
Hgb urine dipstick: NEGATIVE
Leukocytes, UA: NEGATIVE
Protein, ur: NEGATIVE mg/dL
Squamous Epithelial / LPF: NONE SEEN
pH: 6 (ref 5.0–8.0)

## 2013-07-02 ENCOUNTER — Encounter: Payer: Self-pay | Admitting: Gynecology

## 2013-07-03 ENCOUNTER — Other Ambulatory Visit: Payer: Self-pay | Admitting: *Deleted

## 2013-07-03 DIAGNOSIS — R928 Other abnormal and inconclusive findings on diagnostic imaging of breast: Secondary | ICD-10-CM

## 2013-07-11 ENCOUNTER — Other Ambulatory Visit: Payer: Self-pay | Admitting: *Deleted

## 2013-07-11 DIAGNOSIS — R928 Other abnormal and inconclusive findings on diagnostic imaging of breast: Secondary | ICD-10-CM

## 2013-07-12 ENCOUNTER — Other Ambulatory Visit: Payer: Self-pay

## 2013-11-13 ENCOUNTER — Ambulatory Visit (INDEPENDENT_AMBULATORY_CARE_PROVIDER_SITE_OTHER): Payer: 59 | Admitting: Gynecology

## 2013-11-13 ENCOUNTER — Encounter: Payer: Self-pay | Admitting: Gynecology

## 2013-11-13 DIAGNOSIS — N898 Other specified noninflammatory disorders of vagina: Secondary | ICD-10-CM

## 2013-11-13 DIAGNOSIS — N939 Abnormal uterine and vaginal bleeding, unspecified: Secondary | ICD-10-CM

## 2013-11-13 NOTE — Patient Instructions (Signed)
Follow up for sonohysterogram as scheduled. 

## 2013-11-13 NOTE — Progress Notes (Signed)
SHAELEE FORNI Nov 15, 1970 [id number]        43 y.o.  G2P2 presents complaining of several days of light bleeding. History of NovaSure endometrial ablation 2007 with no bleeding after that. No other symptoms such as pain breast tenderness premenstrual type feelings.  Past medical history,surgical history, problem list, medications, allergies, family history and social history were all reviewed and documented in the EPIC chart.  Exam: Kim assistant General appearance  Normal Abdomen soft without masses guarding rebound Pelvic external BUS vagina normal. Cervix normal. Uterus normal sized mobile nontender. Adnexa without masses or tenderness.  Assessment/Plan:  43 y.o. G2P2 episode of bleeding with history of NovaSure endometrial ablation 2007. We'll start with sonohysterogram to look at the endometrial cavity and plan expectant management if normal at this point.   Note: This document was prepared with digital dictation and possible smart phrase technology. Any transcriptional errors that result from this process are unintentional.   Anastasio Auerbach MD, 4:28 PM 11/13/2013

## 2013-11-14 ENCOUNTER — Other Ambulatory Visit: Payer: Self-pay | Admitting: Gynecology

## 2013-11-14 DIAGNOSIS — N939 Abnormal uterine and vaginal bleeding, unspecified: Secondary | ICD-10-CM

## 2013-11-28 ENCOUNTER — Encounter: Payer: Self-pay | Admitting: Gynecology

## 2013-11-28 ENCOUNTER — Ambulatory Visit (INDEPENDENT_AMBULATORY_CARE_PROVIDER_SITE_OTHER): Payer: 59

## 2013-11-28 ENCOUNTER — Ambulatory Visit (INDEPENDENT_AMBULATORY_CARE_PROVIDER_SITE_OTHER): Payer: 59 | Admitting: Gynecology

## 2013-11-28 ENCOUNTER — Other Ambulatory Visit: Payer: Self-pay | Admitting: Gynecology

## 2013-11-28 DIAGNOSIS — N854 Malposition of uterus: Secondary | ICD-10-CM

## 2013-11-28 DIAGNOSIS — N926 Irregular menstruation, unspecified: Secondary | ICD-10-CM

## 2013-11-28 DIAGNOSIS — D251 Intramural leiomyoma of uterus: Secondary | ICD-10-CM

## 2013-11-28 DIAGNOSIS — N8300 Follicular cyst of ovary, unspecified side: Secondary | ICD-10-CM

## 2013-11-28 DIAGNOSIS — N939 Abnormal uterine and vaginal bleeding, unspecified: Secondary | ICD-10-CM

## 2013-11-28 DIAGNOSIS — N898 Other specified noninflammatory disorders of vagina: Secondary | ICD-10-CM

## 2013-11-28 DIAGNOSIS — N831 Corpus luteum cyst of ovary, unspecified side: Secondary | ICD-10-CM

## 2013-11-28 NOTE — Progress Notes (Signed)
Rachel Miller [birth year] [id number]        43 y.o.  G2P2 presents for sonohysterogram. History of NovaSure endometrial ablation 2007. Did well until most recently when had several days of spotting. No cramping pain or other symptoms.  Past medical history,surgical history, problem list, medications, allergies, family history and social history were all reviewed and documented in the EPIC chart.  Ultrasound shows uterus normal size retroverted. Endometrial echo 4.6 mm. 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 Right left ovaries normal with physiologic changes. Cul-de-sac negative.  Sonohysterogram performed, sterile technique catheter introduced several centimeters. Subsequently anterior lip of the cervix was grasped with a single-tooth tenaculum and using a sound the endometrial cavity was attempted to be dilated/sounded. It was still only able to negotiate the lower uterine segment safely. Tenaculum was removed and the sonohysterogram catheter again replaced into the lower uterine segment as verified by the ultrasound mild distention with infusion noted with no abnormalities seen but again did not distend the mid to upper uterine cavity. Endometrial sample taken. Patient tolerated well.  Assessment/Plan:  43 y.o. G2P2 episode of light bleeding after years of amenorrhea status post NovaSure endometrial ablation. Patient will followup for biopsy results. Assuming negative for scant/and adequate then will follow bleeding at present. Will call if continues.   Note: This document was prepared with digital dictation and possible smart phrase technology. Any transcriptional errors that result from this process are unintentional.   Anastasio Auerbach MD, 3:02 PM 11/28/2013

## 2013-11-28 NOTE — Patient Instructions (Signed)
Office will call you with the biopsy result. Call if irregular bleeding continues.

## 2013-12-17 ENCOUNTER — Other Ambulatory Visit: Payer: Self-pay | Admitting: *Deleted

## 2013-12-17 DIAGNOSIS — N63 Unspecified lump in unspecified breast: Secondary | ICD-10-CM

## 2014-01-08 ENCOUNTER — Encounter: Payer: Self-pay | Admitting: Gynecology

## 2014-04-23 ENCOUNTER — Other Ambulatory Visit (HOSPITAL_COMMUNITY)
Admission: RE | Admit: 2014-04-23 | Discharge: 2014-04-23 | Disposition: A | Discharge status: Home / Self Care | Payer: 59 | Source: Ambulatory Visit | Attending: Gynecology | Admitting: Gynecology

## 2014-04-23 ENCOUNTER — Encounter: Payer: Self-pay | Admitting: Gynecology

## 2014-04-23 ENCOUNTER — Ambulatory Visit (INDEPENDENT_AMBULATORY_CARE_PROVIDER_SITE_OTHER): Payer: 59 | Admitting: Gynecology

## 2014-04-23 VITALS — BP 120/76 | Ht 63.0 in | Wt 156.0 lb

## 2014-04-23 DIAGNOSIS — Z124 Encounter for screening for malignant neoplasm of cervix: Secondary | ICD-10-CM | POA: Diagnosis present

## 2014-04-23 DIAGNOSIS — Z1151 Encounter for screening for human papillomavirus (HPV): Secondary | ICD-10-CM | POA: Diagnosis present

## 2014-04-23 DIAGNOSIS — Z01419 Encounter for gynecological examination (general) (routine) without abnormal findings: Secondary | ICD-10-CM

## 2014-04-23 NOTE — Progress Notes (Signed)
Rachel Miller 09/03/1971 [id number]        43 y.o.  G2P2 for annual exam.  Several issues noted below.  Past medical history,surgical history, problem list, medications, allergies, family history and social history were all reviewed and documented as reviewed in the EPIC chart.  ROS:  12 system ROS performed with pertinent positives and negatives included in the history, assessment and plan.   Additional significant findings :  None   Exam: Kim Counsellor Vitals:   04/23/14 1536  BP: 120/76  Height: 5\' 3"  (1.6 m)  Weight: 156 lb (70.761 kg)   General appearance:  Normal affect, orientation and appearance. Skin: Grossly normal HEENT: Without gross lesions.  No cervical or supraclavicular adenopathy. Thyroid normal.  Lungs:  Clear without wheezing, rales or rhonchi Cardiac: RR, without RMG Abdominal:  Soft, nontender, without masses, guarding, rebound, organomegaly or hernia Breasts:  Examined lying and sitting without masses, retractions, discharge or axillary adenopathy. Small classic sebaceous cyst right areola. Pelvic:  Ext/BUS/vagina normal  Cervix normal. Pap/HPV done  Uterus anteverted, normal size, shape and contour, midline and mobile nontender   Adnexa  Without masses or tenderness    Anus and perineum  Normal   Rectovaginal  Normal sphincter tone without palpated masses or tenderness.    Assessment/Plan:  43 y.o. G2P2 female for annual exam without menses, tubal sterilization.   1. Amenorrhea. Status post NovaSure endometrial ablation 2007. Did have episode of bleeding earlier this year and had a negative sonohysterogram. She's had no further bleeding. Patient without symptoms to suggest early menopause. Will continue monitoring she knows to call if any bleeding. 2. Pap smear 2012. Pap/HPV today. No history of significant abnormal Pap smears. Repeat Pap smear at 3-5 year interval assuming this Pap smear is normal per current screening guidelines. 3. Mammography  12/2013. Continue with annual mammography. SBE monthly reviewed. Has classic small sebaceous cyst right areola present for years unchanged. We'll continue to monitor. 4. Health maintenance. Patient reports having recent routine blood work done through her primary physician. No blood work done today. Followup in one year, sooner as needed.   Note: This document was prepared with digital dictation and possible smart phrase technology. Any transcriptional errors that result from this process are unintentional.   Anastasio Auerbach MD, 4:53 PM 04/23/2014

## 2014-04-23 NOTE — Patient Instructions (Signed)
Followup in one year for annual exam, sooner if any issues.  You may obtain a copy of any labs that were done today by logging onto MyChart as outlined in the instructions provided with your AVS (after visit summary). The office will not call with normal lab results but certainly if there are any significant abnormalities then we will contact you.   Health Maintenance, Female A healthy lifestyle and preventative care can promote health and wellness.  Maintain regular health, dental, and eye exams.  Eat a healthy diet. Foods like vegetables, fruits, whole grains, low-fat dairy products, and lean protein foods contain the nutrients you need without too many calories. Decrease your intake of foods high in solid fats, added sugars, and salt. Get information about a proper diet from your caregiver, if necessary.  Regular physical exercise is one of the most important things you can do for your health. Most adults should get at least 150 minutes of moderate-intensity exercise (any activity that increases your heart rate and causes you to sweat) each week. In addition, most adults need muscle-strengthening exercises on 2 or more days a week.   Maintain a healthy weight. The body mass index (BMI) is a screening tool to identify possible weight problems. It provides an estimate of body fat based on height and weight. Your caregiver can help determine your BMI, and can help you achieve or maintain a healthy weight. For adults 20 years and older:  A BMI below 18.5 is considered underweight.  A BMI of 18.5 to 24.9 is normal.  A BMI of 25 to 29.9 is considered overweight.  A BMI of 30 and above is considered obese.  Maintain normal blood lipids and cholesterol by exercising and minimizing your intake of saturated fat. Eat a balanced diet with plenty of fruits and vegetables. Blood tests for lipids and cholesterol should begin at age 20 and be repeated every 5 years. If your lipid or cholesterol levels are  high, you are over 50, or you are a high risk for heart disease, you may need your cholesterol levels checked more frequently.Ongoing high lipid and cholesterol levels should be treated with medicines if diet and exercise are not effective.  If you smoke, find out from your caregiver how to quit. If you do not use tobacco, do not start.  Lung cancer screening is recommended for adults aged 55 80 years who are at high risk for developing lung cancer because of a history of smoking. Yearly low-dose computed tomography (CT) is recommended for people who have at least a 30-pack-year history of smoking and are a current smoker or have quit within the past 15 years. A pack year of smoking is smoking an average of 1 pack of cigarettes a day for 1 year (for example: 1 pack a day for 30 years or 2 packs a day for 15 years). Yearly screening should continue until the smoker has stopped smoking for at least 15 years. Yearly screening should also be stopped for people who develop a health problem that would prevent them from having lung cancer treatment.  If you are pregnant, do not drink alcohol. If you are breastfeeding, be very cautious about drinking alcohol. If you are not pregnant and choose to drink alcohol, do not exceed 1 drink per day. One drink is considered to be 12 ounces (355 mL) of beer, 5 ounces (148 mL) of wine, or 1.5 ounces (44 mL) of liquor.  Avoid use of street drugs. Do not share needles with   anyone. Ask for help if you need support or instructions about stopping the use of drugs.  High blood pressure causes heart disease and increases the risk of stroke. Blood pressure should be checked at least every 1 to 2 years. Ongoing high blood pressure should be treated with medicines, if weight loss and exercise are not effective.  If you are 55 to 44 years old, ask your caregiver if you should take aspirin to prevent strokes.  Diabetes screening involves taking a blood sample to check your fasting  blood sugar level. This should be done once every 3 years, after age 45, if you are within normal weight and without risk factors for diabetes. Testing should be considered at a younger age or be carried out more frequently if you are overweight and have at least 1 risk factor for diabetes.  Breast cancer screening is essential preventative care for women. You should practice "breast self-awareness." This means understanding the normal appearance and feel of your breasts and may include breast self-examination. Any changes detected, no matter how small, should be reported to a caregiver. Women in their 20s and 30s should have a clinical breast exam (CBE) by a caregiver as part of a regular health exam every 1 to 3 years. After age 40, women should have a CBE every year. Starting at age 40, women should consider having a mammogram (breast X-ray) every year. Women who have a family history of breast cancer should talk to their caregiver about genetic screening. Women at a high risk of breast cancer should talk to their caregiver about having an MRI and a mammogram every year.  Breast cancer gene (BRCA)-related cancer risk assessment is recommended for women who have family members with BRCA-related cancers. BRCA-related cancers include breast, ovarian, tubal, and peritoneal cancers. Having family members with these cancers may be associated with an increased risk for harmful changes (mutations) in the breast cancer genes BRCA1 and BRCA2. Results of the assessment will determine the need for genetic counseling and BRCA1 and BRCA2 testing.  The Pap test is a screening test for cervical cancer. Women should have a Pap test starting at age 21. Between ages 21 and 29, Pap tests should be repeated every 2 years. Beginning at age 30, you should have a Pap test every 3 years as long as the past 3 Pap tests have been normal. If you had a hysterectomy for a problem that was not cancer or a condition that could lead to  cancer, then you no longer need Pap tests. If you are between ages 65 and 70, and you have had normal Pap tests going back 10 years, you no longer need Pap tests. If you have had past treatment for cervical cancer or a condition that could lead to cancer, you need Pap tests and screening for cancer for at least 20 years after your treatment. If Pap tests have been discontinued, risk factors (such as a new sexual partner) need to be reassessed to determine if screening should be resumed. Some women have medical problems that increase the chance of getting cervical cancer. In these cases, your caregiver may recommend more frequent screening and Pap tests.  The human papillomavirus (HPV) test is an additional test that may be used for cervical cancer screening. The HPV test looks for the virus that can cause the cell changes on the cervix. The cells collected during the Pap test can be tested for HPV. The HPV test could be used to screen women aged 30   years and older, and should be used in women of any age who have unclear Pap test results. After the age of 30, women should have HPV testing at the same frequency as a Pap test.  Colorectal cancer can be detected and often prevented. Most routine colorectal cancer screening begins at the age of 50 and continues through age 75. However, your caregiver may recommend screening at an earlier age if you have risk factors for colon cancer. On a yearly basis, your caregiver may provide home test kits to check for hidden blood in the stool. Use of a small camera at the end of a tube, to directly examine the colon (sigmoidoscopy or colonoscopy), can detect the earliest forms of colorectal cancer. Talk to your caregiver about this at age 50, when routine screening begins. Direct examination of the colon should be repeated every 5 to 10 years through age 75, unless early forms of pre-cancerous polyps or small growths are found.  Hepatitis C blood testing is recommended for  all people born from 1945 through 1965 and any individual with known risks for hepatitis C.  Practice safe sex. Use condoms and avoid high-risk sexual practices to reduce the spread of sexually transmitted infections (STIs). Sexually active women aged 25 and younger should be checked for Chlamydia, which is a common sexually transmitted infection. Older women with new or multiple partners should also be tested for Chlamydia. Testing for other STIs is recommended if you are sexually active and at increased risk.  Osteoporosis is a disease in which the bones lose minerals and strength with aging. This can result in serious bone fractures. The risk of osteoporosis can be identified using a bone density scan. Women ages 65 and over and women at risk for fractures or osteoporosis should discuss screening with their caregivers. Ask your caregiver whether you should be taking a calcium supplement or vitamin D to reduce the rate of osteoporosis.  Menopause can be associated with physical symptoms and risks. Hormone replacement therapy is available to decrease symptoms and risks. You should talk to your caregiver about whether hormone replacement therapy is right for you.  Use sunscreen. Apply sunscreen liberally and repeatedly throughout the day. You should seek shade when your shadow is shorter than you. Protect yourself by wearing long sleeves, pants, a wide-brimmed hat, and sunglasses year round, whenever you are outdoors.  Notify your caregiver of new moles or changes in moles, especially if there is a change in shape or color. Also notify your caregiver if a mole is larger than the size of a pencil eraser.  Stay current with your immunizations. Document Released: 03/08/2011 Document Revised: 12/18/2012 Document Reviewed: 03/08/2011 [employer] Patient Information 2014 [employer].   

## 2014-04-24 LAB — URINALYSIS W MICROSCOPIC + REFLEX CULTURE
BACTERIA UA: NONE SEEN
Bilirubin Urine: NEGATIVE
CASTS: NONE SEEN
Crystals: NONE SEEN
Glucose, UA: NEGATIVE mg/dL
HGB URINE DIPSTICK: NEGATIVE
KETONES UR: NEGATIVE mg/dL
Leukocytes, UA: NEGATIVE
NITRITE: NEGATIVE
Protein, ur: NEGATIVE mg/dL
Specific Gravity, Urine: <1.005 — ABNORMAL LOW (ref 1.005–1.030)
Squamous Epithelial / LPF: NONE SEEN
Urobilinogen, UA: 0.2 mg/dL (ref 0.0–1.0)
pH: 7 (ref 5.0–8.0)

## 2014-04-25 LAB — CYTOLOGY - PAP

## 2014-05-06 ENCOUNTER — Telehealth: Payer: Self-pay | Admitting: Gynecology

## 2014-05-06 NOTE — Telephone Encounter (Signed)
Six-month PapR Recall entered in Computer.

## 2014-05-06 NOTE — Telephone Encounter (Signed)
I called patient in followup of her most recent Pap smear. It was adequate normal with negative HPV. It did show benign endometrial cells. She is not menstruating secondary to her endometrial ablation. She did have an episode of bleeding after amenorrhea 11/2013 where sonohysterogram showed endometrial echo 4.6 mm with an avascular endometrium. Injection of saline showed lower uterine segment distention but no upper uterine distention. Endometrial sample showed no endometrial tissue but benign endocervical/squamous mucosa.  I reviewed with the patient the findings of endometrial cells and our inability to time them to her LMP. The question is how aggressive in evaluation given her picture of thinner endometrium status post endometrial ablation with an inability to further evaluate the cavity. Options to attempt a more aggressive endometrial sample versus observation discussed. She is overall a low risk having had regular albeit heavy menses preceding her ablation with a negative endometrial biopsy. After a lengthy discussion we both at this point feel comfortable with observation. She knows to call should this any further bleeding I did ask her to come back in 6 months for repeat Pap smear.

## 2014-06-06 ENCOUNTER — Ambulatory Visit: Payer: 59 | Admitting: Gynecology

## 2014-06-07 ENCOUNTER — Ambulatory Visit: Payer: 59 | Admitting: Gynecology

## 2014-07-03 ENCOUNTER — Encounter: Payer: Self-pay | Admitting: Gynecology

## 2014-07-08 ENCOUNTER — Encounter: Payer: Self-pay | Admitting: Gynecology

## 2014-10-25 ENCOUNTER — Ambulatory Visit (INDEPENDENT_AMBULATORY_CARE_PROVIDER_SITE_OTHER): Payer: 59 | Admitting: Gynecology

## 2014-10-25 ENCOUNTER — Other Ambulatory Visit (HOSPITAL_COMMUNITY)
Admission: RE | Admit: 2014-10-25 | Discharge: 2014-10-25 | Disposition: A | Discharge status: Home / Self Care | Payer: 59 | Source: Ambulatory Visit | Attending: Gynecology | Admitting: Gynecology

## 2014-10-25 ENCOUNTER — Encounter: Payer: Self-pay | Admitting: Gynecology

## 2014-10-25 DIAGNOSIS — Z01419 Encounter for gynecological examination (general) (routine) without abnormal findings: Secondary | ICD-10-CM | POA: Diagnosis not present

## 2014-10-25 DIAGNOSIS — R8789 Other abnormal findings in specimens from female genital organs: Secondary | ICD-10-CM

## 2014-10-25 DIAGNOSIS — R87618 Other abnormal cytological findings on specimens from cervix uteri: Secondary | ICD-10-CM

## 2014-10-25 NOTE — Progress Notes (Signed)
Rachel Miller April 10, 1971 [id number]        44 y.o.  G2P2 presents for follow up Pap smear.  History of recent annual exam 04/2014 where her Pap smear showed benign-appearing endometrial cells with no squamous atypia and negative HPV. History of endometrial ablation without regular menses. Does note occasional spotting which I think attributed for the cells.  She did have a subtle histogram due to an episode of bleeding 11/2013. Endometrial echo was 4.6 mm. Attempt at distention of the uterine cavity was unsuccessful with only the lower uterine segment distending. An endometrial sample showed no endometrial tissue but benign endocervical and squamous mucosa. We discussed over the telephone and called we decided to repeat Pap smear in 6 months. She has had several episodes of spotting since then but no heavy bleeding.  Past medical history,surgical history, problem list, medications, allergies, family history and social history were all reviewed and documented in the EPIC chart.  Directed ROS with pertinent positives and negatives documented in the history of present illness/assessment and plan.  Exam: Kim assistant General appearance:  Normal External BUS vagina normal. Cervix normal. Pap smear done. Uterus normal size midline mobile nontender. Adnexa without masses or tenderness.  Assessment/Plan:  44 y.o. G2P2 with history as above. Patient will follow for Pap smear and assuming negative and follow expectantly with follow up in 6 months when she is due for her annual exam.     Anastasio Auerbach MD, 4:22 PM 10/25/2014

## 2014-10-25 NOTE — Patient Instructions (Signed)
Follow up if any issues. Office will call you with Pap smear results within the week.

## 2014-10-25 NOTE — Addendum Note (Signed)
Addended by: Nelva Nay on: 10/25/2014 04:31 PM   Modules accepted: Orders

## 2014-10-29 LAB — CYTOLOGY - PAP

## 2014-11-06 ENCOUNTER — Telehealth: Payer: Self-pay | Admitting: *Deleted

## 2014-11-06 NOTE — Telephone Encounter (Signed)
Pt called requesting pap results on 10/25/14, left on voicemail results normal

## 2015-04-12 ENCOUNTER — Emergency Department (HOSPITAL_BASED_OUTPATIENT_CLINIC_OR_DEPARTMENT_OTHER)
Admission: EM | Admit: 2015-04-12 | Discharge: 2015-04-13 | Disposition: A | Discharge status: Home / Self Care | Payer: 59 | Attending: Emergency Medicine | Admitting: Emergency Medicine

## 2015-04-12 ENCOUNTER — Encounter (HOSPITAL_BASED_OUTPATIENT_CLINIC_OR_DEPARTMENT_OTHER): Payer: Self-pay | Admitting: Emergency Medicine

## 2015-04-12 ENCOUNTER — Emergency Department (HOSPITAL_BASED_OUTPATIENT_CLINIC_OR_DEPARTMENT_OTHER): Payer: 59

## 2015-04-12 DIAGNOSIS — R079 Chest pain, unspecified: Secondary | ICD-10-CM | POA: Insufficient documentation

## 2015-04-12 LAB — BASIC METABOLIC PANEL
Anion gap: 8 (ref 5–15)
BUN: 13 mg/dL (ref 6–20)
CO2: 28 mmol/L (ref 22–32)
CREATININE: 0.64 mg/dL (ref 0.44–1.00)
Calcium: 9.5 mg/dL (ref 8.9–10.3)
Chloride: 104 mmol/L (ref 101–111)
Glucose, Bld: 98 mg/dL (ref 65–99)
Potassium: 3.5 mmol/L (ref 3.5–5.1)
SODIUM: 140 mmol/L (ref 135–145)

## 2015-04-12 LAB — CBC
HCT: 39.4 % (ref 36.0–46.0)
Hemoglobin: 13.3 g/dL (ref 12.0–15.0)
MCH: 31.2 pg (ref 26.0–34.0)
MCHC: 33.8 g/dL (ref 30.0–36.0)
MCV: 92.5 fL (ref 78.0–100.0)
Platelets: 341 10*3/uL (ref 150–400)
RBC: 4.26 MIL/uL (ref 3.87–5.11)
RDW: 12.5 % (ref 11.5–15.5)
WBC: 8 10*3/uL (ref 4.0–10.5)

## 2015-04-12 LAB — TROPONIN I: Troponin I: <0.03 ng/mL (ref <0.031)

## 2015-04-12 NOTE — ED Provider Notes (Signed)
CSN: [id number]   Arrival date & time 04/12/15 2232  History  This chart was scribed for  Veryl Speak, MD by Altamease Oiler, ED Scribe. This patient was seen in room MH06/MH06 and the patient's care was started at 11:41 PM.  Chief Complaint  Patient presents with  . Chest Pain    HPI The history is provided by the patient. No language interpreter was used.   Rachel Miller is a 44 y.o. female who presents to the Emergency Department complaining of intermittent central chest pain with gradual onset 5 days ago. The pain is described as tightness that is worse with sitting upright. She rates the pain 4/10 in severity and notes that it is not exertional or affected by food.  No sweating, SOB, cough,  or nausea. No personal history of heart disease, DM, HTN, or HLD. Family history of MI. Denies tobacco use. Runs near daily without issue.   History reviewed. No pertinent past medical history.  Past Surgical History  Procedure Laterality Date  . Cesarean section  2005  . Tubal ligation  2005  . Novasure ablation  11/2005  . Cholecystectomy      Family History  Problem Relation Age of Onset  . Ovarian cancer Maternal Aunt   . Heart disease Father   . Heart attack Mother     History  Substance Use Topics  . Smoking status: Never Smoker   . Smokeless tobacco: Never Used  . Alcohol Use: Yes     Comment: social     Review of Systems 10 Systems reviewed and all are negative for acute change except as noted in the HPI.  Home Medications   Prior to Admission medications   Not on File    Allergies  Review of patient's allergies indicates no known allergies.  Triage Vitals: BP 157/85 mmHg  Pulse 62  Temp(Src) 98 F (36.7 C) (Oral)  Resp 20  Ht 5\' 2"  (1.575 m)  Wt 150 lb (68.04 kg)  BMI 27.43 kg/m2  SpO2 100%  Physical Exam  Constitutional: She is oriented to person, place, and time. She appears well-developed and well-nourished. No distress.  HENT:  Head: Normocephalic  and atraumatic.  Eyes: EOM are normal. Pupils are equal, round, and reactive to light.  Neck: Normal range of motion.  Cardiovascular: Normal rate, regular rhythm and normal heart sounds.   Pulmonary/Chest: Effort normal and breath sounds normal.  Abdominal: Soft. She exhibits no distension. There is no tenderness.  Musculoskeletal: Normal range of motion.  Neurological: She is alert and oriented to person, place, and time.  Skin: Skin is warm and dry.  Psychiatric: She has a normal mood and affect. Judgment normal.  Nursing note and vitals reviewed.   ED Course  Procedures   DIAGNOSTIC STUDIES: Oxygen Saturation is 100% on RA, normal by my interpretation.    COORDINATION OF CARE: 11:46 PM Discussed treatment plan which includes CXR, EKG, and lab work with pt at bedside and pt agreed to plan.  Labs Review-  Labs Reviewed  BASIC METABOLIC PANEL  CBC  TROPONIN I    Imaging Review No results found.  ED ECG REPORT   Date: 04/12/2015  Rate: 73  Rhythm: normal sinus rhythm  QRS Axis: normal  Intervals: normal  ST/T Wave abnormalities: normal  Conduction Disutrbances:none  Narrative Interpretation:   Old EKG Reviewed: none available  I have personally reviewed the EKG tracing and agree with the computerized printout as noted.  MDM   Final diagnoses:  None     Patient presents with complaints of tightness across the front of her chest for the past 5 days. There is no associated shortness of breath, nausea, or diaphoresis. She has no exertional component and does report that she runs every day without symptoms. Her workup reveals negative troponin, normal laboratory studies, normal EKG, and essentially clear chest x-ray.  Her symptoms do not sound cardiac, she exercises daily with no symptoms, and today's workup is unremarkable. I believe she is appropriate for discharge. She will be given the number for the cardiology clinic to call to arrange a follow-up  appointment if she is not improving in the next 2 days. She understands to return if symptoms worsen or change.  I personally performed the services described in this documentation, which was scribed in my presence. The recorded information has been reviewed and is accurate.      Veryl Speak, MD 04/13/15 [phone]

## 2015-04-12 NOTE — ED Notes (Signed)
Pt in c/o chest tightness onset x 5 days ago. Has progressively gotten worse with radiation to L arm and neck. Denies SOB, NAD at this time.

## 2015-04-13 NOTE — Discharge Instructions (Signed)
Follow-up with the cardiology clinic if not improving in the next 2 days. The contact information for the [facility] cardiology office has been provided in this discharge summary. Call on Monday to arrange this appointment.  Return to the ER if symptoms significantly worsen or change.   Chest Pain (Nonspecific) It is often hard to give a specific diagnosis for the cause of chest pain. There is always a chance that your pain could be related to something serious, such as a heart attack or a blood clot in the lungs. You need to follow up with your health care provider for further evaluation. CAUSES   Heartburn.  Pneumonia or bronchitis.  Anxiety or stress.  Inflammation around your heart (pericarditis) or lung (pleuritis or pleurisy).  A blood clot in the lung.  A collapsed lung (pneumothorax). It can develop suddenly on its own (spontaneous pneumothorax) or from trauma to the chest.  Shingles infection (herpes zoster virus). The chest wall is composed of bones, muscles, and cartilage. Any of these can be the source of the pain.  The bones can be bruised by injury.  The muscles or cartilage can be strained by coughing or overwork.  The cartilage can be affected by inflammation and become sore (costochondritis). DIAGNOSIS  Lab tests or other studies may be needed to find the cause of your pain. Your health care provider may have you take a test called an ambulatory electrocardiogram (ECG). An ECG records your heartbeat patterns over a 24-hour period. You may also have other tests, such as:  Transthoracic echocardiogram (TTE). During echocardiography, sound waves are used to evaluate how blood flows through your heart.  Transesophageal echocardiogram (TEE).  Cardiac monitoring. This allows your health care provider to monitor your heart rate and rhythm in real time.  Holter monitor. This is a portable device that records your heartbeat and can help diagnose heart arrhythmias. It  allows your health care provider to track your heart activity for several days, if needed.  Stress tests by exercise or by giving medicine that makes the heart beat faster. TREATMENT   Treatment depends on what may be causing your chest pain. Treatment may include:  Acid blockers for heartburn.  Anti-inflammatory medicine.  Pain medicine for inflammatory conditions.  Antibiotics if an infection is present.  You may be advised to change lifestyle habits. This includes stopping smoking and avoiding alcohol, caffeine, and chocolate.  You may be advised to keep your head raised (elevated) when sleeping. This reduces the chance of acid going backward from your stomach into your esophagus. Most of the time, nonspecific chest pain will improve within 2-3 days with rest and mild pain medicine.  HOME CARE INSTRUCTIONS   If antibiotics were prescribed, take them as directed. Finish them even if you start to feel better.  For the next few days, avoid physical activities that bring on chest pain. Continue physical activities as directed.  Do not use any tobacco products, including cigarettes, chewing tobacco, or electronic cigarettes.  Avoid drinking alcohol.  Only take medicine as directed by your health care provider.  Follow your health care provider's suggestions for further testing if your chest pain does not go away.  Keep any follow-up appointments you made. If you do not go to an appointment, you could develop lasting (chronic) problems with pain. If there is any problem keeping an appointment, call to reschedule. SEEK MEDICAL CARE IF:   Your chest pain does not go away, even after treatment.  You have a rash  with blisters on your chest.  You have a fever. SEEK IMMEDIATE MEDICAL CARE IF:   You have increased chest pain or pain that spreads to your arm, neck, jaw, back, or abdomen.  You have shortness of breath.  You have an increasing cough, or you cough up blood.  You  have severe back or abdominal pain.  You feel nauseous or vomit.  You have severe weakness.  You faint.  You have chills. This is an emergency. Do not wait to see if the pain will go away. Get medical help at once. Call your local emergency services (911 in [place] Do not drive yourself to the hospital. MAKE SURE YOU:   Understand these instructions.  Will watch your condition.  Will get help right away if you are not doing well or get worse. Document Released: 06/02/2005 Document Revised: 08/28/2013 Document Reviewed: 03/28/2008 [facility] Patient Information 2015 [place], [place]. This information is not intended to replace advice given to you by your health care provider. Make sure you discuss any questions you have with your health care provider.

## 2015-04-18 ENCOUNTER — Encounter: Payer: Self-pay | Admitting: Cardiology

## 2015-04-18 ENCOUNTER — Ambulatory Visit (INDEPENDENT_AMBULATORY_CARE_PROVIDER_SITE_OTHER): Payer: 59 | Admitting: Cardiology

## 2015-04-18 ENCOUNTER — Ambulatory Visit (INDEPENDENT_AMBULATORY_CARE_PROVIDER_SITE_OTHER)
Admission: RE | Admit: 2015-04-18 | Discharge: 2015-04-18 | Disposition: A | Discharge status: Home / Self Care | Payer: 59 | Source: Ambulatory Visit | Attending: Cardiology | Admitting: Cardiology

## 2015-04-18 VITALS — BP 118/76 | HR 84 | Wt 151.2 lb

## 2015-04-18 DIAGNOSIS — R0789 Other chest pain: Secondary | ICD-10-CM | POA: Diagnosis not present

## 2015-04-18 DIAGNOSIS — R072 Precordial pain: Secondary | ICD-10-CM | POA: Diagnosis not present

## 2015-04-18 NOTE — Progress Notes (Signed)
Cardiology Office Note   Date:  04/18/2015   ID:  Rachel Miller, DOB May 04, 1971, MRN [id number]  PCP:  Tereasa Coop, PA-C  Cardiologist:   Minus Breeding, MD   Chief Complaint  Patient presents with  . Chest Pain      History of Present Illness: Rachel Miller is a 44 y.o. female who presents for evaluation of chest discomfort. She has no past cardiac history. However, dramatic family history with her father having had transplant after heart disease in his 4s (she did not know him growing up and so doesn't know the details). Her mother died suddenly of an infarct in her mid 8s. The patient exercises. She noticed over the last week or so that she was having some increasing symptoms when she was at rest. She would have some chest tightness through her chest. It was mild to moderate. There was no radiation. This was on and off for 5 or 6 days. She finally presented to the emergency room where she was not noted to have any EKG changes and enzymes were negative. I reviewed these records. Of note her discomfort was somewhat slightly positional or with deep inspiration. It would come and go spontaneously. She has not had discomfort like this before. She was able to run a few days ago and didn't feel quite as good afterwards but didn't have any discomfort during the study. She's not having any palpitations, presyncope or syncope. She's not having any PND or orthopnea.  No past medical history on file.  Past Surgical History  Procedure Laterality Date  . Cesarean section  2005  . Tubal ligation  2005  . Novasure ablation  11/2005  . Cholecystectomy       Current Outpatient Prescriptions  Medication Sig Dispense Refill  . albuterol (PROAIR HFA) 108 (90 BASE) MCG/ACT inhaler Inhale 2 puffs into the lungs as needed.     No current facility-administered medications for this visit.    Allergies:   Review of patient's allergies indicates no known allergies.    Social History:   The patient  reports that she has never smoked. She has never used smokeless tobacco. She reports that she drinks alcohol. She reports that she does not use illicit drugs.   Family History:  The patient's family history includes Heart attack (age of onset: 2) in her mother; Heart disease (age of onset: 13) in her father; Ovarian cancer in her maternal aunt.    ROS:  Please see the history of present illness.   Otherwise, review of systems are positive for none.   All other systems are reviewed and negative.    PHYSICAL EXAM: VS:  BP 118/76 mmHg  Pulse 84  Wt 151 lb 3.2 oz (68.584 kg) , BMI Body mass index is 27.65 kg/(m^2). GENERAL:  Well appearing HEENT:  Pupils equal round and reactive, fundi not visualized, oral mucosa unremarkable NECK:  No jugular venous distention, waveform within normal limits, carotid upstroke brisk and symmetric, no bruits, no thyromegaly LYMPHATICS:  No cervical, inguinal adenopathy LUNGS:  Clear to auscultation bilaterally BACK:  No CVA tenderness CHEST:  Unremarkable HEART:  PMI not displaced or sustained,S1 and S2 within normal limits, no S3, no S4, no clicks, no rubs, no murmurs ABD:  Flat, positive bowel sounds normal in frequency in pitch, no bruits, no rebound, no guarding, no midline pulsatile mass, no hepatomegaly, no splenomegaly EXT:  2 plus pulses throughout, no edema, no cyanosis no clubbing SKIN:  No rashes  no nodules NEURO:  Cranial nerves II through XII grossly intact, motor grossly intact throughout PSYCH:  Cognitively intact, oriented to person place and time    EKG:  EKG is not ordered today. The ekg ordered 8/6 demonstrates NSR, rate 73, axis WNL, intervals WNL, no acute ST T wave changes.    Recent Labs: 04/12/2015: BUN 13; Creatinine, Ser 0.64; Hemoglobin 13.3; Platelets 341; Potassium 3.5; Sodium 140    Lipid Panel    Component Value Date/Time   CHOL 130 04/18/2013 1531   TRIG 78 04/18/2013 1531   HDL 52 04/18/2013 1531    CHOLHDL 2.5 04/18/2013 1531   VLDL 16 04/18/2013 1531   LDLCALC 62 04/18/2013 1531      Wt Readings from Last 3 Encounters:  04/18/15 151 lb 3.2 oz (68.584 kg)  04/12/15 150 lb (68.04 kg)  04/23/14 156 lb (70.761 kg)      Other studies Reviewed: Additional studies/ records that were reviewed today include: ED records. Review of the above records demonstrates:  Please see elsewhere in the note.     ASSESSMENT AND PLAN:  CHEST PAIN:  Chest pain is atypical. However, she has a significant family history. Given this stress testing is indicated. In addition for risk stratification and to guide [place] in primary risk reduction and follow-up calcium score would be indicated. I will order a POET (Plain Old Exercise Treadmill).     Current medicines are reviewed at length with the patient today.  The patient does not have concerns regarding medicines.  The following changes have been made:  no change  Labs/ tests ordered today include:   Orders Placed This Encounter  Procedures  . CT Cardiac Scoring  . Exercise Tolerance Test     Disposition:   FU with me as needed.      Signed, Minus Breeding, MD  04/18/2015 9:35 AM    [place]

## 2015-04-18 NOTE — Patient Instructions (Signed)
Your physician recommends that you schedule a follow-up appointment As Needed  Your physician has requested that you have an exercise tolerance test. For further information please visit [email or web address]. Please also follow instruction sheet, as given.  Your physician has requested that you have a CT Calcium Score

## 2015-04-21 ENCOUNTER — Ambulatory Visit (HOSPITAL_COMMUNITY)
Admission: RE | Admit: 2015-04-21 | Discharge: 2015-04-21 | Disposition: A | Discharge status: Home / Self Care | Payer: 59 | Source: Ambulatory Visit | Attending: Cardiology | Admitting: Cardiology

## 2015-04-21 DIAGNOSIS — R0789 Other chest pain: Secondary | ICD-10-CM | POA: Insufficient documentation

## 2015-04-21 LAB — EXERCISE TOLERANCE TEST
CSEPHR: 104 %
CSEPPHR: 184 {beats}/min
Estimated workload: 15.3 METS
Exercise duration (min): 13 min
Exercise duration (sec): 0 s
MPHR: 176 {beats}/min
RPE: 15
Rest HR: 65 {beats}/min

## 2015-04-21 NOTE — Progress Notes (Signed)
ETT completed.  See note

## 2015-04-25 ENCOUNTER — Ambulatory Visit (INDEPENDENT_AMBULATORY_CARE_PROVIDER_SITE_OTHER): Payer: 59 | Admitting: Gynecology

## 2015-04-25 ENCOUNTER — Other Ambulatory Visit (HOSPITAL_COMMUNITY)
Admission: RE | Admit: 2015-04-25 | Discharge: 2015-04-25 | Disposition: A | Discharge status: Home / Self Care | Payer: 59 | Source: Ambulatory Visit | Attending: Gynecology | Admitting: Gynecology

## 2015-04-25 ENCOUNTER — Encounter: Payer: Self-pay | Admitting: Gynecology

## 2015-04-25 VITALS — BP 120/76 | Ht 63.0 in | Wt 151.0 lb

## 2015-04-25 DIAGNOSIS — Z01419 Encounter for gynecological examination (general) (routine) without abnormal findings: Secondary | ICD-10-CM

## 2015-04-25 NOTE — Patient Instructions (Signed)

## 2015-04-25 NOTE — Progress Notes (Signed)
MELINA MOSTELLER April 18, 1971 [id number]        44 y.o.  G2P2 for annual exam.  Doing well without complaints.  Past medical history,surgical history, problem list, medications, allergies, family history and social history were all reviewed and documented as reviewed in the EPIC chart.  ROS:  Performed with pertinent positives and negatives included in the history, assessment and plan.   Additional significant findings :  none   Exam: Kim Counsellor Vitals:   04/25/15 1123  BP: 120/76  Height: 5\' 3"  (1.6 m)  Weight: 151 lb (68.493 kg)   General appearance:  Normal affect, orientation and appearance. Skin: Grossly normal HEENT: Without gross lesions.  No cervical or supraclavicular adenopathy. Thyroid normal.  Lungs:  Clear without wheezing, rales or rhonchi Cardiac: RR, without RMG Abdominal:  Soft, nontender, without masses, guarding, rebound, organomegaly or hernia Breasts:  Examined lying and sitting without masses, retractions, discharge or axillary adenopathy. Pelvic:  Ext/BUS/vagina normal  Cervix normal. Pap smear done  Uterus anteverted, normal size, shape and contour, midline and mobile nontender   Adnexa  Without masses or tenderness    Anus and perineum  Normal   Rectovaginal  Normal sphincter tone without palpated masses or tenderness.    Assessment/Plan:  44 y.o. G2P2 female for annual exam without menses, status post endometrial ablation, tubal sterilization.   1. Having no bleeding. Patient was having some spotting last year but this is resolved. No symptoms to suggest early menopause. Continue to monitor. 2. Pap smear 10/2014. Pap smear last year showed benign-appearing endometrial cells. Was attributed to her spotting. Sonohysterogram showed thickened endometrium although unable to distend the cavity due to scarring from her ablation.  Follow up Pap smear was negative. Pap smear done today. 3. Mammography 06/2014. Continue annual mammography when due. SBE  monthly reviewed. 4. Health maintenance. No routine blood work done as this is done through her primary 20 office as well as recently done in the ER where she presented for chest pain with a negative workup. Follow up in one year, sooner as needed.   Anastasio Auerbach MD, 11:46 AM 04/25/2015

## 2015-04-25 NOTE — Addendum Note (Signed)
Addended by: Nelva Nay on: 04/25/2015 12:02 PM   Modules accepted: Orders

## 2015-04-26 LAB — URINALYSIS W MICROSCOPIC + REFLEX CULTURE
Bacteria, UA: NONE SEEN [HPF]
Bilirubin Urine: NEGATIVE
CASTS: NONE SEEN [LPF]
Crystals: NONE SEEN [HPF]
Glucose, UA: NEGATIVE
Hgb urine dipstick: NEGATIVE
Ketones, ur: NEGATIVE
Leukocytes, UA: NEGATIVE
Nitrite: NEGATIVE
Protein, ur: NEGATIVE
RBC / HPF: NONE SEEN RBC/HPF (ref <=2)
SPECIFIC GRAVITY, URINE: 1.006 (ref 1.001–1.035)
Squamous Epithelial / LPF: NONE SEEN [HPF] (ref <=5)
WBC UA: NONE SEEN WBC/HPF (ref <=5)
Yeast: NONE SEEN [HPF]
pH: 7.5 (ref 5.0–8.0)

## 2015-04-29 LAB — CYTOLOGY - PAP

## 2015-07-04 ENCOUNTER — Encounter: Payer: Self-pay | Admitting: Gynecology

## 2016-04-26 ENCOUNTER — Encounter: Payer: 59 | Admitting: Gynecology

## 2016-05-13 ENCOUNTER — Ambulatory Visit (INDEPENDENT_AMBULATORY_CARE_PROVIDER_SITE_OTHER): Payer: 59 | Admitting: Gynecology

## 2016-05-13 ENCOUNTER — Encounter: Payer: Self-pay | Admitting: Gynecology

## 2016-05-13 VITALS — BP 120/76 | Ht 64.0 in | Wt 154.0 lb

## 2016-05-13 DIAGNOSIS — Z13220 Encounter for screening for lipoid disorders: Secondary | ICD-10-CM | POA: Diagnosis not present

## 2016-05-13 DIAGNOSIS — Z01419 Encounter for gynecological examination (general) (routine) without abnormal findings: Secondary | ICD-10-CM | POA: Diagnosis not present

## 2016-05-13 LAB — COMPREHENSIVE METABOLIC PANEL
ALK PHOS: 47 U/L (ref 33–115)
ALT: 14 U/L (ref 6–29)
AST: 17 U/L (ref 10–35)
Albumin: 4.6 g/dL (ref 3.6–5.1)
BILIRUBIN TOTAL: 0.3 mg/dL (ref 0.2–1.2)
BUN: 11 mg/dL (ref 7–25)
CALCIUM: 9.1 mg/dL (ref 8.6–10.2)
CO2: 25 mmol/L (ref 20–31)
Chloride: 106 mmol/L (ref 98–110)
Creat: 0.63 mg/dL (ref 0.50–1.10)
Glucose, Bld: 86 mg/dL (ref 65–99)
POTASSIUM: 3.9 mmol/L (ref 3.5–5.3)
Sodium: 136 mmol/L (ref 135–146)
TOTAL PROTEIN: 6.9 g/dL (ref 6.1–8.1)

## 2016-05-13 LAB — CBC WITH DIFFERENTIAL/PLATELET
Basophils Absolute: 0 cells/uL (ref 0–200)
Basophils Relative: 0 %
EOS ABS: 162 {cells}/uL (ref 15–500)
Eosinophils Relative: 2 %
HEMATOCRIT: 39.7 % (ref 35.0–45.0)
HEMOGLOBIN: 13.3 g/dL (ref 11.7–15.5)
LYMPHS ABS: 2673 {cells}/uL (ref 850–3900)
Lymphocytes Relative: 33 %
MCH: 30.6 pg (ref 27.0–33.0)
MCHC: 33.5 g/dL (ref 32.0–36.0)
MCV: 91.5 fL (ref 80.0–100.0)
MONO ABS: 405 {cells}/uL (ref 200–950)
MPV: 10.2 fL (ref 7.5–12.5)
Monocytes Relative: 5 %
NEUTROS ABS: 4860 {cells}/uL (ref 1500–7800)
Neutrophils Relative %: 60 %
Platelets: 392 10*3/uL (ref 140–400)
RBC: 4.34 MIL/uL (ref 3.80–5.10)
RDW: 13.3 % (ref 11.0–15.0)
WBC: 8.1 10*3/uL (ref 3.8–10.8)

## 2016-05-13 LAB — LIPID PANEL
Cholesterol: 162 mg/dL (ref 125–200)
HDL: 67 mg/dL (ref >=46)
LDL Cholesterol: 82 mg/dL (ref <130)
Total CHOL/HDL Ratio: 2.4 Ratio (ref <=5.0)
Triglycerides: 67 mg/dL (ref <150)
VLDL: 13 mg/dL (ref <30)

## 2016-05-13 NOTE — Progress Notes (Signed)
    Rachel Miller [birth year] [id number]        45 y.o.  G2P2  for annual exam.  Doing well without complaints  Past medical history,surgical history, problem list, medications, allergies, family history and social history were all reviewed and documented as reviewed in the EPIC chart.  ROS:  Performed with pertinent positives and negatives included in the history, assessment and plan.   Additional significant findings :  None   Exam: Caryn Bee assistant Vitals:   05/13/16 1559  BP: 120/76  Weight: 154 lb (69.9 kg)  Height: 5\' 4"  (1.626 m)   Body mass index is 26.43 kg/m.  General appearance:  Normal affect, orientation and appearance. Skin: Grossly normal HEENT: Without gross lesions.  No cervical or supraclavicular adenopathy. Thyroid normal.  Lungs:  Clear without wheezing, rales or rhonchi Cardiac: RR, without RMG Abdominal:  Soft, nontender, without masses, guarding, rebound, organomegaly or hernia Breasts:  Examined lying and sitting without masses, retractions, discharge or axillary adenopathy. Pelvic:  Ext/BUS/Vagina normal  Cervix normal  Uterus anteverted, normal size, shape and contour, midline and mobile nontender   Adnexa without masses or tenderness    Anus and perineum normal   Rectovaginal normal sphincter tone without palpated masses or tenderness.    Assessment/Plan:  45 y.o. G2P2 female for annual exam without menses, tubal sterilization.   1. Amenorrhea. Status post endometrial ablation. Was having some spotting urine a half ago. Had negative sonohysterogram.  And she has done no further spotting or any bleeding. No symptoms to suggest menopause. Continue to monitor. 2. Pap smear 04/2015. No Pap smear done today. Did have Pap smear 2015 which showed benign-appearing endometrial cells which was thought due to her spotting at that time. Follow up Pap smears have been negative. Plan repeat Pap smear at 3 year interval per current screening  guidelines. 3. Mammography coming due end of October/November and I reminded her to schedule this. SBE monthly reviewed. 4. Health maintenance. Requests baseline labs. CBC, CMP, lipid profile, urinalysis ordered. Follow up 1 year, sooner as needed.Anastasio Auerbach MD, 4:27 PM 05/13/2016

## 2016-05-13 NOTE — Patient Instructions (Signed)

## 2016-05-14 LAB — URINALYSIS W MICROSCOPIC + REFLEX CULTURE
Bacteria, UA: NONE SEEN [HPF]
Bilirubin Urine: NEGATIVE
Casts: NONE SEEN [LPF]
Crystals: NONE SEEN [HPF]
GLUCOSE, UA: NEGATIVE
Hgb urine dipstick: NEGATIVE
Ketones, ur: NEGATIVE
LEUKOCYTES UA: NEGATIVE
NITRITE: NEGATIVE
PH: 7 (ref 5.0–8.0)
PROTEIN: NEGATIVE
RBC / HPF: NONE SEEN RBC/HPF (ref <=2)
Specific Gravity, Urine: 1.001 (ref 1.001–1.035)
Squamous Epithelial / LPF: NONE SEEN [HPF] (ref <=5)
WBC, UA: NONE SEEN WBC/HPF (ref <=5)
YEAST: NONE SEEN [HPF]

## 2016-05-16 ENCOUNTER — Emergency Department (HOSPITAL_BASED_OUTPATIENT_CLINIC_OR_DEPARTMENT_OTHER)
Admission: EM | Admit: 2016-05-16 | Discharge: 2016-05-16 | Disposition: A | Discharge status: Other Acute Inpatient Hospital | Payer: 59 | Attending: Physician Assistant | Admitting: Physician Assistant

## 2016-05-16 ENCOUNTER — Emergency Department (HOSPITAL_BASED_OUTPATIENT_CLINIC_OR_DEPARTMENT_OTHER)
Admission: EM | Admit: 2016-05-16 | Discharge: 2016-05-16 | Disposition: A | Discharge status: Home / Self Care | Payer: 59 | Source: Home / Self Care | Attending: Emergency Medicine | Admitting: Emergency Medicine

## 2016-05-16 ENCOUNTER — Encounter (HOSPITAL_BASED_OUTPATIENT_CLINIC_OR_DEPARTMENT_OTHER): Payer: Self-pay | Admitting: *Deleted

## 2016-05-16 ENCOUNTER — Encounter (HOSPITAL_BASED_OUTPATIENT_CLINIC_OR_DEPARTMENT_OTHER): Payer: Self-pay | Admitting: Emergency Medicine

## 2016-05-16 DIAGNOSIS — L500 Allergic urticaria: Secondary | ICD-10-CM

## 2016-05-16 DIAGNOSIS — R110 Nausea: Secondary | ICD-10-CM

## 2016-05-16 DIAGNOSIS — T7840XA Allergy, unspecified, initial encounter: Secondary | ICD-10-CM | POA: Diagnosis not present

## 2016-05-16 DIAGNOSIS — Z79899 Other long term (current) drug therapy: Secondary | ICD-10-CM | POA: Diagnosis not present

## 2016-05-16 DIAGNOSIS — L509 Urticaria, unspecified: Secondary | ICD-10-CM | POA: Diagnosis present

## 2016-05-16 LAB — CBC WITH DIFFERENTIAL/PLATELET
BASOS ABS: 0 10*3/uL (ref 0.0–0.1)
BASOS PCT: 0 %
EOS PCT: 0 %
Eosinophils Absolute: 0 10*3/uL (ref 0.0–0.7)
HCT: 42.3 % (ref 36.0–46.0)
Hemoglobin: 14.7 g/dL (ref 12.0–15.0)
Lymphocytes Relative: 4 %
Lymphs Abs: 0.9 10*3/uL (ref 0.7–4.0)
MCH: 31.7 pg (ref 26.0–34.0)
MCHC: 34.8 g/dL (ref 30.0–36.0)
MCV: 91.2 fL (ref 78.0–100.0)
MONO ABS: 0.8 10*3/uL (ref 0.1–1.0)
Monocytes Relative: 4 %
Neutro Abs: 18.3 10*3/uL — ABNORMAL HIGH (ref 1.7–7.7)
Neutrophils Relative %: 92 %
PLATELETS: 409 10*3/uL — AB (ref 150–400)
RBC: 4.64 MIL/uL (ref 3.87–5.11)
RDW: 13 % (ref 11.5–15.5)
WBC: 19.9 10*3/uL — ABNORMAL HIGH (ref 4.0–10.5)

## 2016-05-16 LAB — BASIC METABOLIC PANEL
ANION GAP: 7 (ref 5–15)
BUN: 9 mg/dL (ref 6–20)
CALCIUM: 9.1 mg/dL (ref 8.9–10.3)
CO2: 24 mmol/L (ref 22–32)
Chloride: 102 mmol/L (ref 101–111)
Creatinine, Ser: 0.59 mg/dL (ref 0.44–1.00)
Glucose, Bld: 99 mg/dL (ref 65–99)
POTASSIUM: 3.8 mmol/L (ref 3.5–5.1)
SODIUM: 133 mmol/L — AB (ref 135–145)

## 2016-05-16 MED ORDER — FAMOTIDINE 20 MG PO TABS: 20.0000 mg | ORAL_TABLET | Freq: Two times a day (BID) | ORAL | 0 refills | Status: DC

## 2016-05-16 MED ORDER — ONDANSETRON HCL 4 MG/2ML IJ SOLN
4.0000 mg | Freq: Once | INTRAMUSCULAR | Status: AC
  Administered 2016-05-16: 4 mg via INTRAVENOUS
  Filled 2016-05-16: qty 2

## 2016-05-16 MED ORDER — DIPHENHYDRAMINE HCL 25 MG PO TABS: 25.0000 mg | ORAL_TABLET | Freq: Four times a day (QID) | ORAL | 0 refills | Status: DC

## 2016-05-16 MED ORDER — DIPHENHYDRAMINE HCL 50 MG/ML IJ SOLN
INTRAMUSCULAR | Status: AC
  Filled 2016-05-16: qty 1

## 2016-05-16 MED ORDER — EPINEPHRINE 0.3 MG/0.3ML IJ SOAJ
0.3000 mg | Freq: Once | INTRAMUSCULAR | Status: AC
  Administered 2016-05-16: 0.3 mg via INTRAMUSCULAR
  Filled 2016-05-16: qty 0.3

## 2016-05-16 MED ORDER — DIPHENHYDRAMINE HCL 50 MG/ML IJ SOLN
25.0000 mg | Freq: Once | INTRAMUSCULAR | Status: AC
  Administered 2016-05-16: 25 mg via INTRAVENOUS
  Filled 2016-05-16: qty 1

## 2016-05-16 MED ORDER — METHYLPREDNISOLONE SODIUM SUCC 125 MG IJ SOLR
125.0000 mg | Freq: Once | INTRAMUSCULAR | Status: AC
  Administered 2016-05-16: 125 mg via INTRAVENOUS
  Filled 2016-05-16: qty 2

## 2016-05-16 MED ORDER — PREDNISONE 20 MG PO TABS: 40.0000 mg | ORAL_TABLET | Freq: Every day | ORAL | 0 refills | Status: DC

## 2016-05-16 MED ORDER — FAMOTIDINE IN NACL 20-0.9 MG/50ML-% IV SOLN
INTRAVENOUS | Status: AC
  Filled 2016-05-16: qty 50

## 2016-05-16 MED ORDER — METHYLPREDNISOLONE SODIUM SUCC 125 MG IJ SOLR
125.0000 mg | Freq: Once | INTRAMUSCULAR | Status: AC
  Administered 2016-05-16: 125 mg via INTRAVENOUS

## 2016-05-16 MED ORDER — METHYLPREDNISOLONE SODIUM SUCC 125 MG IJ SOLR
INTRAMUSCULAR | Status: AC
  Administered 2016-05-16: 125 mg via INTRAVENOUS
  Filled 2016-05-16: qty 2

## 2016-05-16 MED ORDER — FAMOTIDINE IN NACL 20-0.9 MG/50ML-% IV SOLN
20.0000 mg | Freq: Once | INTRAVENOUS | Status: AC
  Administered 2016-05-16: 20 mg via INTRAVENOUS

## 2016-05-16 MED ORDER — ONDANSETRON 8 MG PO TBDP
8.0000 mg | ORAL_TABLET | Freq: Three times a day (TID) | ORAL | 0 refills | Status: DC | PRN
Start: 2016-05-16 — End: 2017-05-16

## 2016-05-16 MED ORDER — FAMOTIDINE IN NACL 20-0.9 MG/50ML-% IV SOLN
20.0000 mg | Freq: Once | INTRAVENOUS | Status: AC
  Administered 2016-05-16: 20 mg via INTRAVENOUS
  Filled 2016-05-16: qty 50

## 2016-05-16 MED ORDER — DIPHENHYDRAMINE HCL 50 MG/ML IJ SOLN
25.0000 mg | Freq: Once | INTRAMUSCULAR | Status: AC
  Administered 2016-05-16: 25 mg via INTRAVENOUS

## 2016-05-16 MED ORDER — METOCLOPRAMIDE HCL 5 MG/ML IJ SOLN
10.0000 mg | Freq: Once | INTRAMUSCULAR | Status: AC
  Administered 2016-05-16: 10 mg via INTRAVENOUS
  Filled 2016-05-16: qty 2

## 2016-05-16 NOTE — ED Notes (Signed)
Pt states that she feels better but her throat still feels a little tight. MD aware. No distress. resp even and unlabored. Sinus on tele. 02 sats 94%

## 2016-05-16 NOTE — ED Provider Notes (Signed)
[facility] Provider Note   CSN: [id number] Arrival date & time: 05/16/16  0212     History   Chief Complaint Chief Complaint  Patient presents with  . Allergic Reaction    HPI Rachel Miller is a 45 y.o. female.  Patient with no prior history of allergic reactions presents the emergency department with hives of her upper lower extremities and neck.  She was seen by her doctor 2 days ago for similar symptoms and was given a dose of steroids I prescribed prednisone taper and hydroxyzine for itch.  Patient has not taken the prednisone.  She states she felt like she was improving but then this evening felt like her symptoms worsen.  She felt like she had some swelling in her throat and felt like her hives on her legs and feet were worsening.  She did not take any Benadryl prior to arrival.  She has no difficulty speaking or swallowing.  No prior history of allergic reactions.  She does report she says chills without documented fever.  Her symptoms are moderate to severe in severity.   The history is provided by the patient and the spouse.    History reviewed. No pertinent past medical history.  Patient Active Problem List   Diagnosis Date Noted  . Precordial chest pain 04/18/2015    Past Surgical History:  Procedure Laterality Date  . CESAREAN SECTION  2005  . CHOLECYSTECTOMY    . NOVASURE ABLATION  11/2005  . TUBAL LIGATION  2005    OB History    Gravida Para Term Preterm AB Living   2 2       2    SAB TAB Ectopic Multiple Live Births                   Home Medications    Prior to Admission medications   Medication Sig Start Date End Date Taking? Authorizing Provider  hydrOXYzine (VISTARIL) 100 MG capsule Take 100 mg by mouth 3 (three) times daily as needed for itching.   Yes Historical Provider, MD  predniSONE (DELTASONE) 20 MG tablet Take 20 mg by mouth daily with breakfast.   Yes Historical Provider, MD    Family History Family History    Problem Relation Age of Onset  . Heart attack Mother 55    Died from this.   . Heart disease Father 14    Transplant at [facility].  (She does not know the details)  . Ovarian cancer Maternal Aunt     Social History Social History  Substance Use Topics  . Smoking status: Never Smoker  . Smokeless tobacco: Never Used  . Alcohol use 0.0 oz/week     Comment: social     Allergies   Review of patient's allergies indicates no known allergies.   Review of Systems Review of Systems  All other systems reviewed and are negative.    Physical Exam Updated Vital Signs BP 131/66 (BP Location: Right Arm)   Pulse 63   Temp 97.7 F (36.5 C) (Oral)   Resp 19   Ht 5\' 3"  (1.6 m)   Wt 150 lb (68 kg)   SpO2 94%   BMI 26.57 kg/m   Physical Exam  Constitutional: She is oriented to person, place, and time. She appears well-developed and well-nourished. No distress.  HENT:  Head: Normocephalic and atraumatic.  Uvula is midline.  Posterior pharyngeal normal appearance.  No significant swelling or edema.  Eyes: EOM are normal.  Neck: Normal  range of motion.  Cardiovascular: Normal rate, regular rhythm and normal heart sounds.   Pulmonary/Chest: Effort normal and breath sounds normal.  Abdominal: Soft. She exhibits no distension. There is no tenderness.  Musculoskeletal: Normal range of motion.  Neurological: She is alert and oriented to person, place, and time.  Skin: Skin is warm and dry.  Urticaria of her bilateral feet and bilateral knees present.  Urticaria of the left side of her neck and across her dorsum of her left hand.  Psychiatric: She has a normal mood and affect. Judgment normal.  Nursing note and vitals reviewed.    ED Treatments / Results  Labs (all labs ordered are listed, but only abnormal results are displayed) Labs Reviewed - No data to display  EKG  EKG Interpretation None       Radiology No results found.  Procedures Procedures (including critical care  time)  Medications Ordered in ED Medications  EPINEPHrine (EPI-PEN) injection 0.3 mg (0.3 mg Intramuscular Given 05/16/16 0244)  methylPREDNISolone sodium succinate (SOLU-MEDROL) 125 mg/2 mL injection 125 mg (125 mg Intravenous Given 05/16/16 0242)  ondansetron (ZOFRAN) injection 4 mg (4 mg Intravenous Given 05/16/16 0238)  famotidine (PEPCID) IVPB 20 mg premix (0 mg Intravenous Stopped 05/16/16 0554)  diphenhydrAMINE (BENADRYL) injection 25 mg (25 mg Intravenous Given 05/16/16 0240)  metoCLOPramide (REGLAN) injection 10 mg (10 mg Intravenous Given 05/16/16 0338)     Initial Impression / Assessment and Plan / ED Course  I have reviewed the triage vital signs and the nursing notes.  Pertinent labs & imaging results that were available during my care of the patient were reviewed by me and considered in my medical decision making (see chart for details).  Clinical Course  Patient was given epinephrine not for anaphylaxis but for severe hives.  She feels much better.  Her nausea was treated.  She's had chills over the past couple dayssuspect this is allergic reviewed response to a virus given her viral symptoms.  She was told to take the prednisone as needed.  She did not take a dose.  She has been given hydroxyzine to help with H and reports it does not seem to be helping.  She feels better this time.  Plan is to schedule her 40 mg daily prednisone 5 days followed by daily twice a day dosing of the Pepcid.  Patient understands return to the ER for new or worsening symptoms.  Final Clinical Impressions(s) / ED Diagnoses   Final diagnoses:  None    New Prescriptions Discharge Medication List as of 05/16/2016  4:43 AM    START taking these medications   Details  diphenhydrAMINE (BENADRYL) 25 MG tablet Take 1 tablet (25 mg total) by mouth every 6 (six) hours., Starting Sun 05/16/2016, Print    famotidine (PEPCID) 20 MG tablet Take 1 tablet (20 mg total) by mouth 2 (two) times daily., Starting  Sun 05/16/2016, Print    ondansetron (ZOFRAN ODT) 8 MG disintegrating tablet Take 1 tablet (8 mg total) by mouth every 8 (eight) hours as needed for nausea or vomiting., Starting Sun 05/16/2016, Print         Jola Schmidt, MD 05/16/16 [phone]

## 2016-05-16 NOTE — ED Notes (Signed)
Pt states she feels a little nauseated again. MD aware and orders received.

## 2016-05-16 NOTE — ED Notes (Signed)
Pt states that she feels much better. General hives are less red than arrival. 02 sats are 1005 on RA. Sinus 20s on tele

## 2016-05-16 NOTE — ED Triage Notes (Signed)
Pt tx for allergic reaction here early this morning. Pt states she was treated with epi pen, benadryl and steroids. Pt states symptoms returned 1 hour ago. Pt has diffuse hives and reports difficulty swallowing.

## 2016-05-16 NOTE — ED Notes (Signed)
Pt sinus on tele.

## 2016-05-16 NOTE — ED Provider Notes (Signed)
[facility] Provider Note   CSN: [id number] Arrival date & time: 05/16/16  1616 By signing my name below, I, Dyke Brackett, attest that this documentation has been prepared under the direction and in the presence of non-physician practitioner, Montine Circle, PA-C  Electronically Signed: Dyke Brackett, Scribe. 05/16/2016. 4:40 PM.   History   Chief Complaint Chief Complaint  Patient presents with  . Allergic Reaction    HPI Rachel Miller is a 45 y.o. female who presents to the Emergency Department complaining of recurrent,  hives to her torso and extremities onset two days ago. She notes associated difficulty swallowing, nausea, and vomiting. Pt describes her hives as itching and states they appear to be worsening. Pt was seen for the same this morning and treated with epi-pen, benadryl, and steroids with inital relief. She states one hour ago her symptoms returned. Per husband, pt was seen by her PCP two days ago and had some improvement after steroids. She has never experienced these symptoms before. No new soaps, lotions, detergents, foods, animals, plants, or medications (specifically ACE or ARBs). She has possible sick contact; pt states she has been in the hospital to visit her father recently and works as a Proofreader. Pt has no hx of heart problems.   The history is provided by the patient. No language interpreter was used.    History reviewed. No pertinent past medical history.  Patient Active Problem List   Diagnosis Date Noted  . Precordial chest pain 04/18/2015    Past Surgical History:  Procedure Laterality Date  . CESAREAN SECTION  2005  . CHOLECYSTECTOMY    . NOVASURE ABLATION  11/2005  . TUBAL LIGATION  2005    OB History    Gravida Para Term Preterm AB Living   2 2       2    SAB TAB Ectopic Multiple Live Births                  Home Medications    Prior to Admission medications   Medication Sig Start Date End Date Taking?  Authorizing Provider  diphenhydrAMINE (BENADRYL) 25 MG tablet Take 1 tablet (25 mg total) by mouth every 6 (six) hours. 05/16/16   Jola Schmidt, MD  famotidine (PEPCID) 20 MG tablet Take 1 tablet (20 mg total) by mouth 2 (two) times daily. 05/16/16   Jola Schmidt, MD  ondansetron (ZOFRAN ODT) 8 MG disintegrating tablet Take 1 tablet (8 mg total) by mouth every 8 (eight) hours as needed for nausea or vomiting. 05/16/16   Jola Schmidt, MD  predniSONE (DELTASONE) 20 MG tablet Take 2 tablets (40 mg total) by mouth daily. 05/16/16   Jola Schmidt, MD    Family History Family History  Problem Relation Age of Onset  . Heart attack Mother 70    Died from this.   . Heart disease Father 13    Transplant at [facility].  (She does not know the details)  . Ovarian cancer Maternal Aunt     Social History Social History  Substance Use Topics  . Smoking status: Never Smoker  . Smokeless tobacco: Never Used  . Alcohol use 0.0 oz/week     Comment: social     Allergies   Review of patient's allergies indicates no known allergies.  Review of Systems Review of Systems  HENT: Positive for trouble swallowing.   Gastrointestinal: Positive for nausea and vomiting.  Skin: Positive for rash.  All other systems reviewed and are negative.  Physical Exam Updated Vital Signs There were no vitals taken for this visit.  Physical Exam  Constitutional: She appears well-developed and well-nourished. No distress.  HENT:  Head: Normocephalic and atraumatic.  Oropharynx is clear No visible swelling No tongue swelling Mallampati 2  Eyes: Conjunctivae are normal.  Neck: Neck supple.  No stridor  Cardiovascular: Normal rate and regular rhythm.   No murmur heard. Pulmonary/Chest: Effort normal and breath sounds normal. No respiratory distress. She has no wheezes. She has no rales.  CTAB No wheezing  Abdominal: Soft. There is no tenderness.  Musculoskeletal: She exhibits no edema.  Neurological: She is alert.    Skin: Skin is warm and dry. Rash noted.  Diffuse hives through extremities and torso  Psychiatric: She has a normal mood and affect.  Nursing note and vitals reviewed.  ED Treatments / Results  DIAGNOSTIC STUDIES:  Oxygen Saturation is 100% on RA, normal by my interpretation.    COORDINATION OF CARE:  4:32 PM Will order benadryl, Pepcid, and epi-pen.  Discussed treatment plan with pt at bedside and pt agreed to plan.  Labs (all labs ordered are listed, but only abnormal results are displayed) Labs Reviewed - No data to display  EKG  EKG Interpretation None       Radiology No results found.  Procedures Procedures (including critical care time)  Medications Ordered in ED Medications - No data to display   Initial Impression / Assessment and Plan / ED Course  I have reviewed the triage vital signs and the nursing notes.  Pertinent labs & imaging results that were available during my care of the patient were reviewed by me and considered in my medical decision making (see chart for details).  Clinical Course    Patient with recurrent allergic reaction with diffuse hives, throat swelling sensation, and nausea and vomiting.  Some of her rash has characteristics of erythema multiforme.  If not viral, than etiology is unclear.  It is however concerning that she continues to have recurrent episodes of throat swelling sensation, n/v. She received an EpiPen this morning and was seen in the ED. Her symptoms have since returned. She received an additional dose of EpiPen, Solu-Medrol, Pepcid, and Benadryl. Patient was seen by and discussed with Dr. Thomasene Lot, who recommends admission to the hospital. I discussed patient with Dr. Exie Parody, who made the suggestion that the patient may be better served by going to [facility] as they have access to allergists and dermatologists. I agree with his suggestion and appreciate his input.  Appreciate Dr. Wynetta Emery from the [facility] Hospitalist  service for accepting the patient in transfer.  Final Clinical Impressions(s) / ED Diagnoses   Final diagnoses:  Allergic reaction, initial encounter    New Prescriptions New Prescriptions   No medications on file  I personally performed the services described in this documentation, which was scribed in my presence. The recorded information has been reviewed and is accurate.      Montine Circle, PA-C 05/16/16 1814    Montine Circle, PA-C 05/16/16 1815    Courteney Julio Alm, MD 05/16/16 [id number]

## 2016-05-16 NOTE — ED Triage Notes (Addendum)
Pt presents with an allergic reaction that started on Friday. States she had general hives. Does not know what she is allergic to. Denies any new meds, products, or foods. States she was seen by her primary MD on Friday and was treated with a "steroid" injection and started on prednisone and hydrozyzine. States that rash got a little bit better on Sat but this morning general hives were worse. C/o joint pain and feet tender bilateral and chills.  States she is having some difficulty with swallowing 02 sats 94% on RA. resp even and unlabored. MD at bedside on arrival.

## 2016-07-16 ENCOUNTER — Encounter: Payer: Self-pay | Admitting: Gynecology

## 2017-05-16 ENCOUNTER — Encounter: Payer: Self-pay | Admitting: Gynecology

## 2017-05-16 ENCOUNTER — Ambulatory Visit (INDEPENDENT_AMBULATORY_CARE_PROVIDER_SITE_OTHER): Payer: 59 | Admitting: Gynecology

## 2017-05-16 VITALS — BP 120/80 | Ht 64.0 in | Wt 149.0 lb

## 2017-05-16 DIAGNOSIS — Z13220 Encounter for screening for lipoid disorders: Secondary | ICD-10-CM

## 2017-05-16 DIAGNOSIS — Z01419 Encounter for gynecological examination (general) (routine) without abnormal findings: Secondary | ICD-10-CM | POA: Diagnosis not present

## 2017-05-16 DIAGNOSIS — N920 Excessive and frequent menstruation with regular cycle: Secondary | ICD-10-CM

## 2017-05-16 NOTE — Progress Notes (Signed)
    Rachel Miller [birth year] [id number]        46 y.o.  G2P2 for annual gynecologic exam.  Had episode of spotting in June and a separate episode in July. No pain cramping. No menopausal symptoms like hot flushes, night sweats or vaginal dryness. Status post endometrial ablation in the past.  Had sonohysterogram due to spotting in the past 2015 which showed endometrial cavity obliterated with inability to pass the catheter or distend the cavity. Biopsy showed benign endocervical tissue but no endometrial tissue. Endometrial echo measured or 0.6 mm at that time.  Past medical history,surgical history, problem list, medications, allergies, family history and social history were all reviewed and documented as reviewed in the EPIC chart.  ROS:  Performed with pertinent positives and negatives included in the history, assessment and plan.   Additional significant findings :  None   Exam: Wandra Scot assistant Vitals:   05/16/17 1551  BP: 120/80  Weight: 149 lb (67.6 kg)  Height: 5\' 4"  (1.626 m)   Body mass index is 25.58 kg/m.  General appearance:  Normal affect, orientation and appearance. Skin: Grossly normal HEENT: Without gross lesions.  No cervical or supraclavicular adenopathy. Thyroid normal.  Lungs:  Clear without wheezing, rales or rhonchi Cardiac: RR, without RMG Abdominal:  Soft, nontender, without masses, guarding, rebound, organomegaly or hernia Breasts:  Examined lying and sitting without masses, retractions, discharge or axillary adenopathy. Pelvic:  Ext, BUS, Vagina: Normal  Cervix: Normal. Pap smear done  Uterus: Anteverted, normal size, shape and contour, midline and mobile nontender   Adnexa: Without masses or tenderness    Anus and perineum: Normal   Rectovaginal: Normal sphincter tone without palpated masses or tenderness.    Assessment/Plan:  45 y.o. G2P2 female for annual gynecologic exam without menses, history of endometrial ablation. Tubal  sterilization  1. Spotting 2 episodes June and July. Status post endometrial ablation in the past. No evidence of menopause symptoms. Discussed options to include observation for now as it's not unreasonable to have occasional spotting after ablation in a premenopausal woman. Evaluation previously showed the endometrial cavity obliterated and unable to sample or distend it. Options for ultrasound now also discussed the look at the endometrial echo recognizing though evaluation would be difficult in follow up if there is some question as to the cavity. At this point the patient's very comfortable with just observing and as long she is doing no significant bleeding or prolonged bleeding then to monitor. Will check baseline FSH now to reassure she is in a premenopausal status. 2. Pap smear 2016. Pap smear done today. History of benign-appearing endometrial cells 2015 thought to be secondary to her spotting with follow up Pap smear negative. No history of dysplasia or significant abnormal Pap smears in the past. 3. Mammography coming due in November and I reminded her to schedule this. Breast exam normal today. 4. Health maintenance. Baseline CBC, CMP, lipid profile, FSH and urinalysis ordered. Follow up for results. Follow up in one year for annual exam.   Anastasio Auerbach MD, 4:30 PM 05/16/2017

## 2017-05-16 NOTE — Patient Instructions (Signed)
Follow up if significant bleeding.  Office will call you with the lab test results.

## 2017-05-17 ENCOUNTER — Encounter: Payer: Self-pay | Admitting: Gynecology

## 2017-05-17 LAB — URINALYSIS W MICROSCOPIC + REFLEX CULTURE
BACTERIA UA: NONE SEEN /HPF
Bilirubin Urine: NEGATIVE
GLUCOSE, UA: NEGATIVE
HYALINE CAST: NONE SEEN /LPF
Hgb urine dipstick: NEGATIVE
Ketones, ur: NEGATIVE
Leukocyte Esterase: NEGATIVE
Nitrites, Initial: NEGATIVE
PH: 7 (ref 5.0–8.0)
Protein, ur: NEGATIVE
RBC / HPF: NONE SEEN /HPF (ref 0–2)
SPECIFIC GRAVITY, URINE: 1.004 (ref 1.001–1.03)
Squamous Epithelial / LPF: NONE SEEN /HPF (ref <=5)
WBC, UA: NONE SEEN /HPF (ref 0–5)

## 2017-05-17 LAB — COMPREHENSIVE METABOLIC PANEL
AG Ratio: 1.9 (calc) (ref 1.0–2.5)
ALBUMIN MSPROF: 4.4 g/dL (ref 3.6–5.1)
ALKALINE PHOSPHATASE (APISO): 39 U/L (ref 33–115)
ALT: 9 U/L (ref 6–29)
AST: 14 U/L (ref 10–35)
BUN: 12 mg/dL (ref 7–25)
CO2: 28 mmol/L (ref 20–32)
CREATININE: 0.87 mg/dL (ref 0.50–1.10)
Calcium: 9.7 mg/dL (ref 8.6–10.2)
Chloride: 102 mmol/L (ref 98–110)
GLUCOSE: 91 mg/dL (ref 65–99)
Globulin: 2.3 g/dL (calc) (ref 1.9–3.7)
POTASSIUM: 3.8 mmol/L (ref 3.5–5.3)
Sodium: 137 mmol/L (ref 135–146)
TOTAL PROTEIN: 6.7 g/dL (ref 6.1–8.1)
Total Bilirubin: 0.3 mg/dL (ref 0.2–1.2)

## 2017-05-17 LAB — CBC WITH DIFFERENTIAL/PLATELET
BASOS PCT: 0.5 %
Basophils Absolute: 37 cells/uL (ref 0–200)
EOS PCT: 1.4 %
Eosinophils Absolute: 102 cells/uL (ref 15–500)
HEMATOCRIT: 38.5 % (ref 35.0–45.0)
HEMOGLOBIN: 13 g/dL (ref 11.7–15.5)
LYMPHS ABS: 2351 {cells}/uL (ref 850–3900)
MCH: 30.6 pg (ref 27.0–33.0)
MCHC: 33.8 g/dL (ref 32.0–36.0)
MCV: 90.6 fL (ref 80.0–100.0)
MPV: 10.5 fL (ref 7.5–12.5)
Monocytes Relative: 7.7 %
NEUTROS ABS: 4249 {cells}/uL (ref 1500–7800)
Neutrophils Relative %: 58.2 %
Platelets: 389 10*3/uL (ref 140–400)
RBC: 4.25 10*6/uL (ref 3.80–5.10)
RDW: 12.8 % (ref 11.0–15.0)
Total Lymphocyte: 32.2 %
WBC: 7.3 10*3/uL (ref 3.8–10.8)
WBCMIX: 562 {cells}/uL (ref 200–950)

## 2017-05-17 LAB — NO CULTURE INDICATED

## 2017-05-17 LAB — PAP IG W/ RFLX HPV ASCU

## 2017-05-17 LAB — LIPID PANEL
CHOL/HDL RATIO: 2.2 (calc) (ref <5.0)
CHOLESTEROL: 138 mg/dL (ref <200)
HDL: 63 mg/dL (ref >50)
LDL CHOLESTEROL (CALC): 59 mg/dL
NON-HDL CHOLESTEROL (CALC): 75 mg/dL (ref <130)
Triglycerides: 82 mg/dL (ref <150)

## 2017-05-17 LAB — FOLLICLE STIMULATING HORMONE: FSH: 6.1 m[IU]/mL

## 2017-06-18 IMAGING — CT CT HEART SCORING
1 series · 15 of 20 positions shown, 19 images · non-contrast
Comparison: No priors.

CLINICAL DATA: Risk stratification

EXAM:
Coronary Calcium Score
TECHNIQUE: The patient was scanned on a Siemens Sensation 16 slice scanner.
Axial non-contrast 3 mm slices were carried out through the heart.
The data set was analyzed on a dedicated work station and scored
using the Agatson method.

[Series 5: st 3mm · axial · 0.59mm/px · z∈[-385,-283]mm · 15 of 40 slices shown, 19 images]
[im 3/40  vessel]
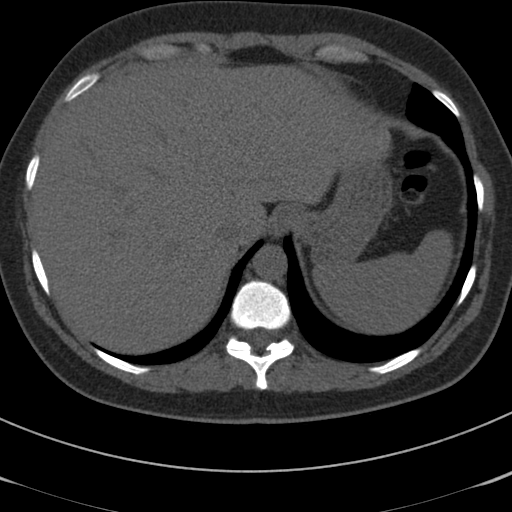
[im 3/40  lung]
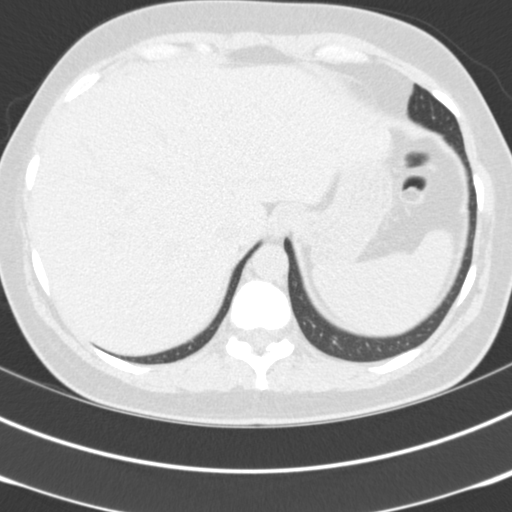
[im 5/40  vessel]
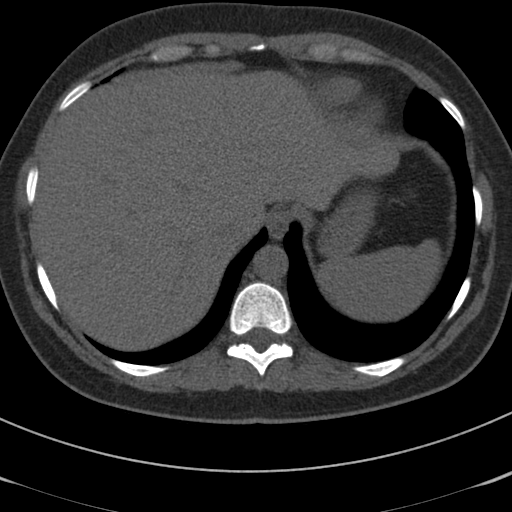
[im 9/40  vessel]
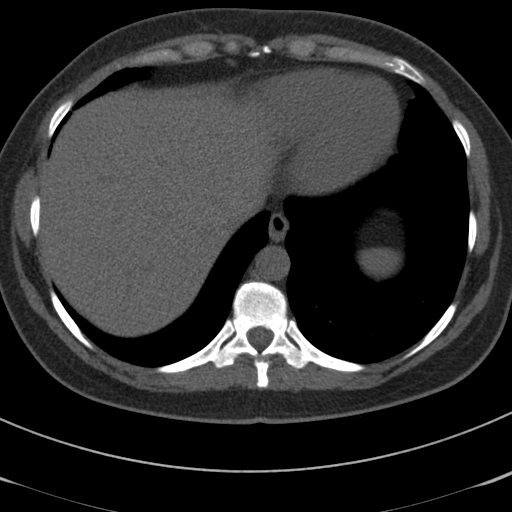
[im 11/40  vessel]
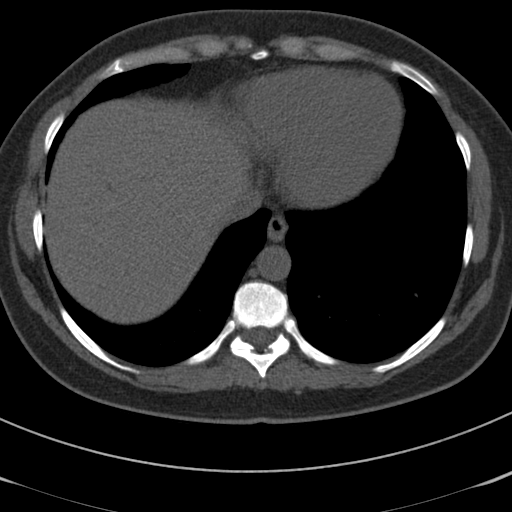
[im 13/40  vessel]
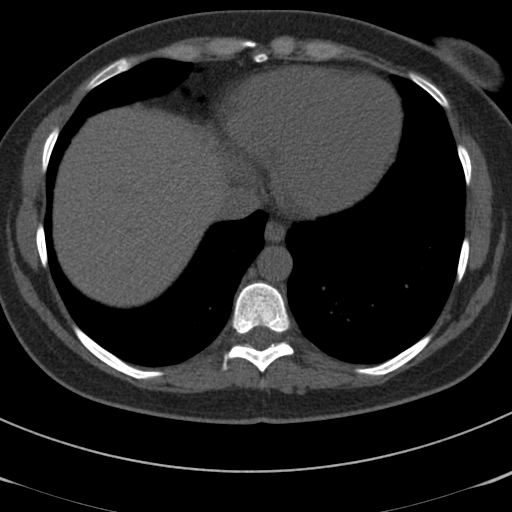
[im 13/40  lung]
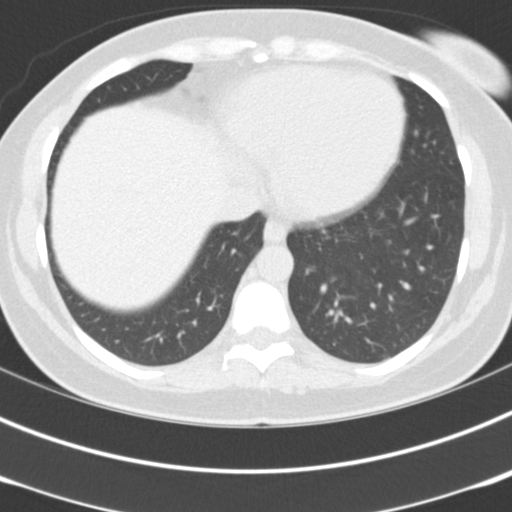
[im 15/40  vessel]
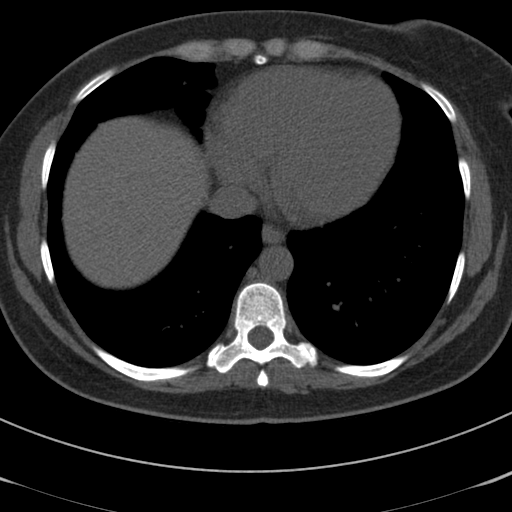
[im 17/40  vessel]
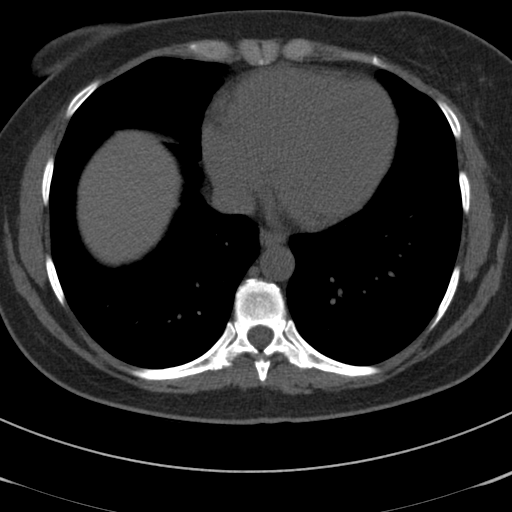
[im 21/40  vessel]
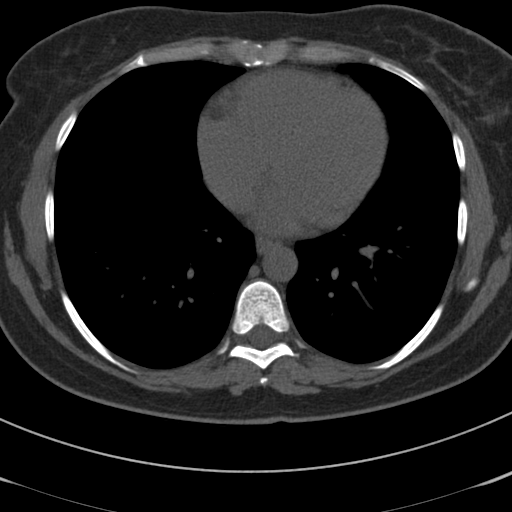
[im 23/40  vessel]
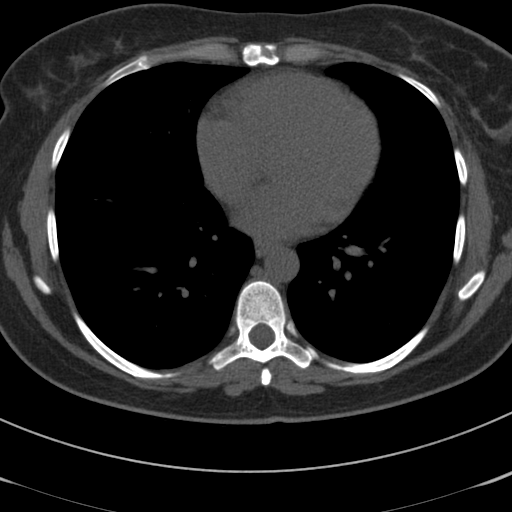
[im 23/40  lung]
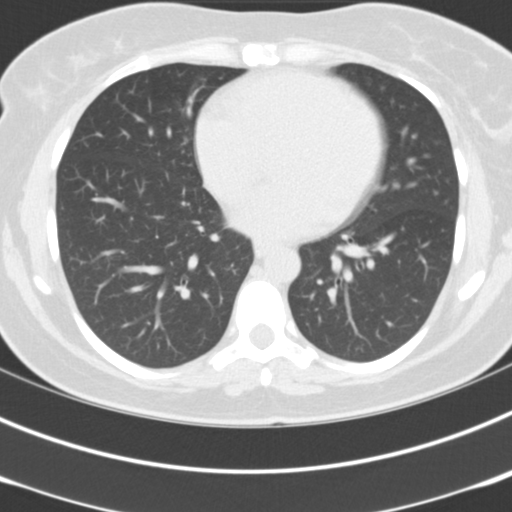
[im 25/40  vessel]
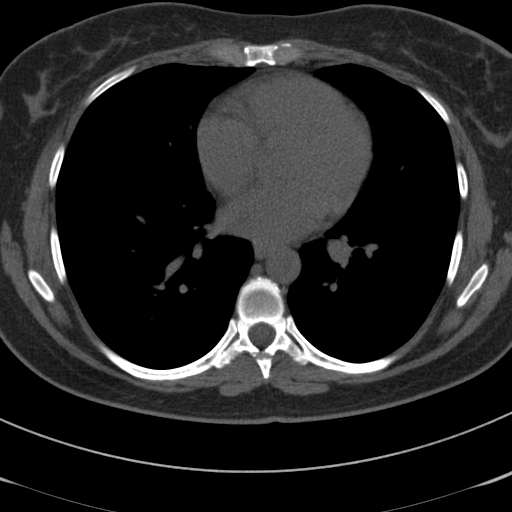
[im 27/40  vessel]
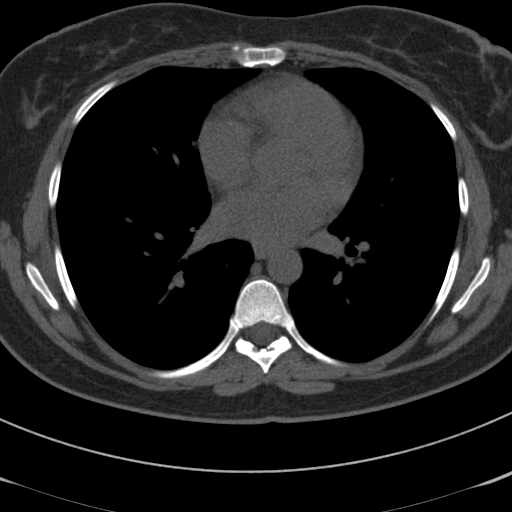
[im 29/40  vessel]
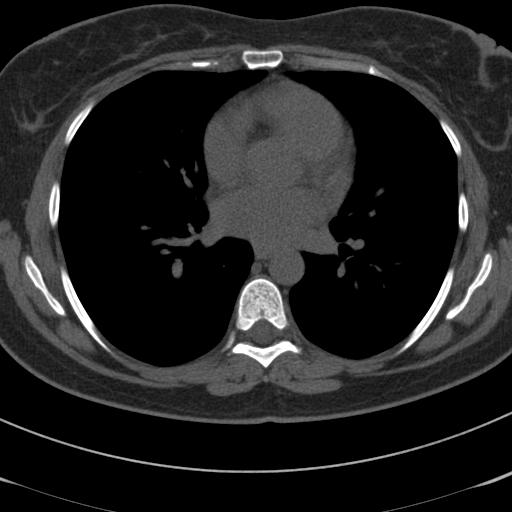
[im 33/40  vessel]
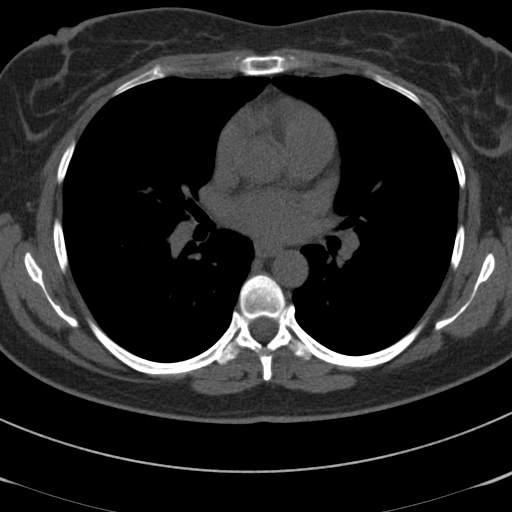
[im 33/40  lung]
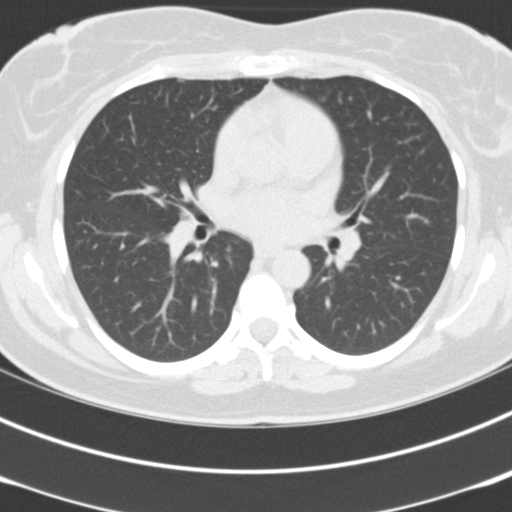
[im 35/40  vessel]
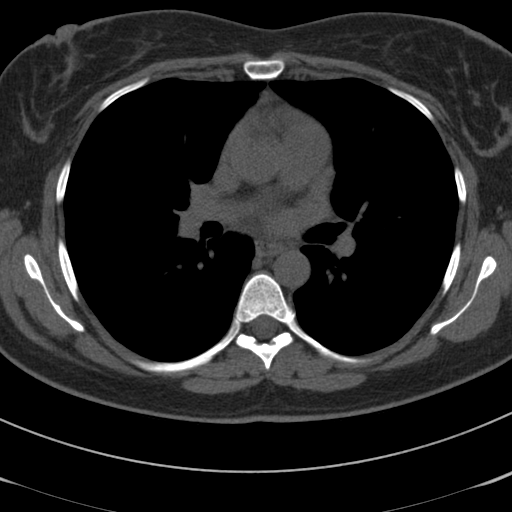
[im 37/40  vessel]
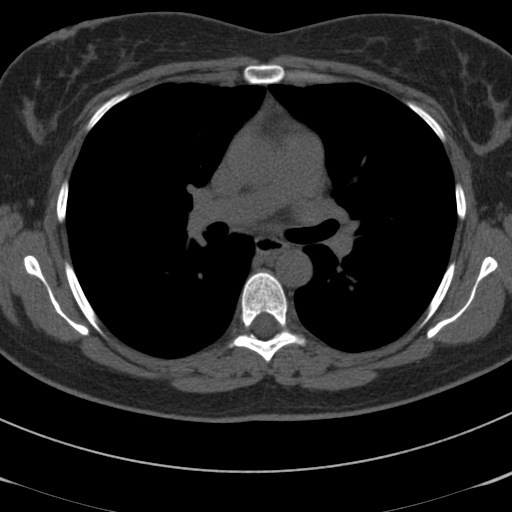

[15 of 20 positions shown; findings below may reference images not displayed]

FINDINGS: Non-cardiac: See separate report from [REDACTED].

Ascending Aorta:  Normal caliber.

Pericardium: Normal

Coronary arteries:  Normal origin.
IMPRESSION: Coronary calcium score of 0. This was 0 percentile for age and sex
matched control.

Tejkula Stefanac

EXAM:
OVER-READ INTERPRETATION  CT CHEST

The following report is an over-read performed by radiologist Dr.
over-read does not include interpretation of cardiac or coronary
anatomy or pathology. The coronary calcium score interpretation by
the cardiologist is attached.
FINDINGS: 4 mm subpleural nodule in the right middle lobe (image 9 of series
4). Within the visualized portions of the thorax there are no larger
more suspicious appearing pulmonary nodules or masses. There is no
acute consolidative airspace disease, pleural effusion, pneumothorax
or lymphadenopathy in the visualized portions of the thorax.
Visualized portions of the upper abdomen are unremarkable. There are
no aggressive appearing lytic or blastic lesions noted in the
visualized portions of the skeleton.
IMPRESSION: 1. Tiny 4 mm subpleural nodule in the anterior aspect of the right
middle lobe. This is highly nonspecific, and statistically likely to
represent a benign subpleural lymph node. If the patient is at high
risk for bronchogenic carcinoma, follow-up chest CT at 1 year is
recommended. If the patient is at low risk, no follow-up is needed.
This recommendation follows the consensus statement: Guidelines for
Management of Small Pulmonary Nodules Detected on CT Scans: A
Statement from the [HOSPITAL] as published in Radiology

## 2018-05-18 ENCOUNTER — Encounter: Payer: 59 | Admitting: Gynecology

## 2018-07-12 ENCOUNTER — Encounter: Payer: Self-pay | Admitting: Gynecology

## 2018-07-12 ENCOUNTER — Ambulatory Visit (INDEPENDENT_AMBULATORY_CARE_PROVIDER_SITE_OTHER): Payer: 59 | Admitting: Gynecology

## 2018-07-12 VITALS — BP 116/76 | Ht 64.0 in | Wt 159.0 lb

## 2018-07-12 DIAGNOSIS — Z01419 Encounter for gynecological examination (general) (routine) without abnormal findings: Secondary | ICD-10-CM | POA: Diagnosis not present

## 2018-07-12 NOTE — Patient Instructions (Signed)
Follow-up in 1 year for annual exam, sooner if any issues. 

## 2018-07-12 NOTE — Progress Notes (Signed)
    Rachel Miller 02-01-1971 [id number]        47 y.o.  G2P2 for annual gynecologic exam.  Doing well without gynecologic complaints.  Past medical history,surgical history, problem list, medications, allergies, family history and social history were all reviewed and documented as reviewed in the EPIC chart.  ROS:  Performed with pertinent positives and negatives included in the history, assessment and plan.   Additional significant findings : None   Exam: Caryn Bee assistant Vitals:   07/12/18 1605  BP: 116/76  Weight: 159 lb (72.1 kg)  Height: 5\' 4"  (1.626 m)   Body mass index is 27.29 kg/m.  General appearance:  Normal affect, orientation and appearance. Skin: Grossly normal HEENT: Without gross lesions.  No cervical or supraclavicular adenopathy. Thyroid normal.  Lungs:  Clear without wheezing, rales or rhonchi Cardiac: RR, without RMG Abdominal:  Soft, nontender, without masses, guarding, rebound, organomegaly or hernia Breasts:  Examined lying and sitting without masses, retractions, discharge or axillary adenopathy. Pelvic:  Ext, BUS, Vagina: Normal  Cervix: Normal  Uterus: Anteverted, normal size, shape and contour, midline and mobile nontender   Adnexa: Without masses or tenderness    Anus and perineum: Normal   Rectovaginal: Normal sphincter tone without palpated masses or tenderness.    Assessment/Plan:  47 y.o. G2P2 female for annual gynecologic exam without menses, tubal sterilization.   1. Continues without menses/any bleeding status post endometrial ablation in the past.  No menopausal symptoms.  Continue to monitor and report any bleeding or menopausal symptoms. 2. Mammography scheduled she will follow-up for this.  Breast exam normal today. 3. Pap smear 2018.  No Pap smear done today.  No history of significant abnormal Pap smears in the past.  Plan follow-up Pap smear at 3-year interval per current screening guidelines. 4. Health maintenance.   Baseline CBC, CMP done today.  Lipid profile excellent last year not repeated.  Follow-up 1 year, sooner as needed.   Anastasio Auerbach MD, 4:30 PM 07/12/2018

## 2018-07-13 LAB — CBC WITH DIFFERENTIAL/PLATELET
Basophils Absolute: 61 cells/uL (ref 0–200)
Basophils Relative: 0.7 %
EOS ABS: 226 {cells}/uL (ref 15–500)
Eosinophils Relative: 2.6 %
HEMATOCRIT: 37.7 % (ref 35.0–45.0)
Hemoglobin: 12.6 g/dL (ref 11.7–15.5)
LYMPHS ABS: 2767 {cells}/uL (ref 850–3900)
MCH: 31.1 pg (ref 27.0–33.0)
MCHC: 33.4 g/dL (ref 32.0–36.0)
MCV: 93.1 fL (ref 80.0–100.0)
MPV: 10.9 fL (ref 7.5–12.5)
Monocytes Relative: 7.6 %
NEUTROS PCT: 57.3 %
Neutro Abs: 4985 cells/uL (ref 1500–7800)
Platelets: 360 10*3/uL (ref 140–400)
RBC: 4.05 10*6/uL (ref 3.80–5.10)
RDW: 12.2 % (ref 11.0–15.0)
Total Lymphocyte: 31.8 %
WBC: 8.7 10*3/uL (ref 3.8–10.8)
WBCMIX: 661 {cells}/uL (ref 200–950)

## 2018-07-13 LAB — COMPREHENSIVE METABOLIC PANEL
AG Ratio: 2.4 (calc) (ref 1.0–2.5)
ALBUMIN MSPROF: 4.5 g/dL (ref 3.6–5.1)
ALT: 17 U/L (ref 6–29)
AST: 16 U/L (ref 10–35)
Alkaline phosphatase (APISO): 64 U/L (ref 33–115)
BUN: 13 mg/dL (ref 7–25)
CO2: 25 mmol/L (ref 20–32)
Calcium: 9.1 mg/dL (ref 8.6–10.2)
Chloride: 103 mmol/L (ref 98–110)
Creat: 0.71 mg/dL (ref 0.50–1.10)
GLOBULIN: 1.9 g/dL (ref 1.9–3.7)
Glucose, Bld: 85 mg/dL (ref 65–99)
Potassium: 3.5 mmol/L (ref 3.5–5.3)
SODIUM: 139 mmol/L (ref 135–146)
TOTAL PROTEIN: 6.4 g/dL (ref 6.1–8.1)
Total Bilirubin: 0.3 mg/dL (ref 0.2–1.2)

## 2018-07-19 ENCOUNTER — Encounter: Payer: Self-pay | Admitting: Gynecology

## 2019-05-29 ENCOUNTER — Encounter: Payer: Self-pay | Admitting: Gynecology

## 2019-07-17 ENCOUNTER — Other Ambulatory Visit: Payer: Self-pay

## 2019-07-18 ENCOUNTER — Encounter: Payer: Self-pay | Admitting: Gynecology

## 2019-07-18 ENCOUNTER — Other Ambulatory Visit: Payer: Self-pay

## 2019-07-18 ENCOUNTER — Ambulatory Visit (INDEPENDENT_AMBULATORY_CARE_PROVIDER_SITE_OTHER): Payer: 59 | Admitting: Gynecology

## 2019-07-18 ENCOUNTER — Encounter: Payer: 59 | Admitting: Gynecology

## 2019-07-18 VITALS — BP 122/78 | Ht 64.0 in | Wt 170.0 lb

## 2019-07-18 DIAGNOSIS — N912 Amenorrhea, unspecified: Secondary | ICD-10-CM

## 2019-07-18 DIAGNOSIS — Z13220 Encounter for screening for lipoid disorders: Secondary | ICD-10-CM

## 2019-07-18 DIAGNOSIS — Z01419 Encounter for gynecological examination (general) (routine) without abnormal findings: Secondary | ICD-10-CM | POA: Diagnosis not present

## 2019-07-18 NOTE — Patient Instructions (Signed)
Follow-up in 1 year for annual exam, sooner if any issues. 

## 2019-07-18 NOTE — Progress Notes (Signed)
    Rachel Miller 1971/02/05 [id number]        48 y.o.  G2P2 for annual gynecologic exam.  Without gynecologic complaints  Past medical history,surgical history, problem list, medications, allergies, family history and social history were all reviewed and documented as reviewed in the EPIC chart.  ROS:  Performed with pertinent positives and negatives included in the history, assessment and plan.   Additional significant findings : None   Exam: Caryn Bee assistant Vitals:   07/18/19 1130  BP: 122/78  Weight: 170 lb (77.1 kg)  Height: 5\' 4"  (1.626 m)   Body mass index is 29.18 kg/m.  General appearance:  Normal affect, orientation and appearance. Skin: Grossly normal HEENT: Without gross lesions.  No cervical or supraclavicular adenopathy. Thyroid normal.  Lungs:  Clear without wheezing, rales or rhonchi Cardiac: RR, without RMG Abdominal:  Soft, nontender, without masses, guarding, rebound, organomegaly or hernia Breasts:  Examined lying and sitting without masses, retractions, discharge or axillary adenopathy. Pelvic:  Ext, BUS, Vagina: Normal  Cervix: Normal  Uterus: Anteverted, normal size, shape and contour, midline and mobile nontender   Adnexa: Without masses or tenderness    Anus and perineum: Normal   Rectovaginal: Normal sphincter tone without palpated masses or tenderness.    Assessment/Plan:  48 y.o. G2P2 female for annual gynecologic exam.  Without menses, tubal sterilization  1. Continues without menses status post endometrial ablation.  No significant menopausal symptoms.  Check baseline FSH. 2. Mammography due now and patient reminded to schedule.  Breast exam normal today. 3. Pap smear 2018.  No Pap smear done today.  No history of significant abnormal Pap smears.  Plan repeat Pap smear next year at 3-year interval per current screening guidelines. 4. Health maintenance.  Baseline CBC, CMP, lipid profile, FSH ordered.  Follow-up 1 year, sooner as  needed.   Anastasio Auerbach MD, 11:58 AM 07/18/2019

## 2019-07-19 ENCOUNTER — Encounter: Payer: Self-pay | Admitting: Gynecology

## 2019-07-19 LAB — COMPREHENSIVE METABOLIC PANEL
AG Ratio: 2.4 (calc) (ref 1.0–2.5)
ALT: 27 U/L (ref 6–29)
AST: 19 U/L (ref 10–35)
Albumin: 4.6 g/dL (ref 3.6–5.1)
Alkaline phosphatase (APISO): 51 U/L (ref 31–125)
BUN: 10 mg/dL (ref 7–25)
CO2: 27 mmol/L (ref 20–32)
Calcium: 9.5 mg/dL (ref 8.6–10.2)
Chloride: 103 mmol/L (ref 98–110)
Creat: 0.58 mg/dL (ref 0.50–1.10)
Globulin: 1.9 g/dL (calc) (ref 1.9–3.7)
Glucose, Bld: 93 mg/dL (ref 65–99)
Potassium: 4 mmol/L (ref 3.5–5.3)
Sodium: 137 mmol/L (ref 135–146)
Total Bilirubin: 0.3 mg/dL (ref 0.2–1.2)
Total Protein: 6.5 g/dL (ref 6.1–8.1)

## 2019-07-19 LAB — LIPID PANEL
Cholesterol: 161 mg/dL (ref <200)
HDL: 62 mg/dL (ref >=50)
LDL Cholesterol (Calc): 79 mg/dL (calc)
Non-HDL Cholesterol (Calc): 99 mg/dL (calc) (ref <130)
Total CHOL/HDL Ratio: 2.6 (calc) (ref <5.0)
Triglycerides: 114 mg/dL (ref <150)

## 2019-07-19 LAB — CBC WITH DIFFERENTIAL/PLATELET
Absolute Monocytes: 624 cells/uL (ref 200–950)
Basophils Absolute: 57 cells/uL (ref 0–200)
Basophils Relative: 0.7 %
Eosinophils Absolute: 122 cells/uL (ref 15–500)
Eosinophils Relative: 1.5 %
HCT: 39.3 % (ref 35.0–45.0)
Hemoglobin: 13.1 g/dL (ref 11.7–15.5)
Lymphs Abs: 2041 cells/uL (ref 850–3900)
MCH: 30.9 pg (ref 27.0–33.0)
MCHC: 33.3 g/dL (ref 32.0–36.0)
MCV: 92.7 fL (ref 80.0–100.0)
MPV: 10.9 fL (ref 7.5–12.5)
Monocytes Relative: 7.7 %
Neutro Abs: 5257 cells/uL (ref 1500–7800)
Neutrophils Relative %: 64.9 %
Platelets: 337 10*3/uL (ref 140–400)
RBC: 4.24 10*6/uL (ref 3.80–5.10)
RDW: 12.6 % (ref 11.0–15.0)
Total Lymphocyte: 25.2 %
WBC: 8.1 10*3/uL (ref 3.8–10.8)

## 2019-07-19 LAB — FOLLICLE STIMULATING HORMONE: FSH: 8.5 m[IU]/mL

## 2019-07-25 ENCOUNTER — Encounter: Payer: Self-pay | Admitting: Gynecology

## 2019-08-10 ENCOUNTER — Other Ambulatory Visit: Payer: Self-pay | Admitting: *Deleted

## 2019-08-10 DIAGNOSIS — Z20822 Contact with and (suspected) exposure to covid-19: Secondary | ICD-10-CM

## 2019-08-13 LAB — NOVEL CORONAVIRUS, NAA: SARS-CoV-2, NAA: NOT DETECTED

## 2020-07-22 ENCOUNTER — Encounter: Payer: 59 | Admitting: Gynecology

## 2020-07-22 ENCOUNTER — Encounter: Payer: 59 | Admitting: Obstetrics and Gynecology

## 2020-09-18 ENCOUNTER — Encounter: Payer: 59 | Admitting: Obstetrics and Gynecology

## 2022-12-12 ENCOUNTER — Other Ambulatory Visit: Payer: Self-pay

## 2022-12-12 ENCOUNTER — Encounter (HOSPITAL_BASED_OUTPATIENT_CLINIC_OR_DEPARTMENT_OTHER): Payer: Self-pay | Admitting: Emergency Medicine

## 2022-12-12 ENCOUNTER — Emergency Department (HOSPITAL_BASED_OUTPATIENT_CLINIC_OR_DEPARTMENT_OTHER): Payer: BC Managed Care – PPO

## 2022-12-12 ENCOUNTER — Emergency Department (HOSPITAL_BASED_OUTPATIENT_CLINIC_OR_DEPARTMENT_OTHER)
Admission: EM | Admit: 2022-12-12 | Discharge: 2022-12-12 | Disposition: A | Discharge status: Home / Self Care | Payer: BC Managed Care – PPO | Attending: Emergency Medicine | Admitting: Emergency Medicine

## 2022-12-12 DIAGNOSIS — R109 Unspecified abdominal pain: Secondary | ICD-10-CM | POA: Diagnosis not present

## 2022-12-12 DIAGNOSIS — R0789 Other chest pain: Secondary | ICD-10-CM | POA: Diagnosis present

## 2022-12-12 DIAGNOSIS — R7309 Other abnormal glucose: Secondary | ICD-10-CM | POA: Diagnosis not present

## 2022-12-12 DIAGNOSIS — F419 Anxiety disorder, unspecified: Secondary | ICD-10-CM | POA: Insufficient documentation

## 2022-12-12 LAB — COMPREHENSIVE METABOLIC PANEL
ALT: 17 U/L (ref 0–44)
AST: 22 U/L (ref 15–41)
Albumin: 4.6 g/dL (ref 3.5–5.0)
Alkaline Phosphatase: 60 U/L (ref 38–126)
Anion gap: 8 (ref 5–15)
BUN: 15 mg/dL (ref 6–20)
CO2: 28 mmol/L (ref 22–32)
Calcium: 9.7 mg/dL (ref 8.9–10.3)
Chloride: 101 mmol/L (ref 98–111)
Creatinine, Ser: 0.64 mg/dL (ref 0.44–1.00)
GFR, Estimated: >60 mL/min (ref >60)
Glucose, Bld: 113 mg/dL — ABNORMAL HIGH (ref 70–99)
Potassium: 3.9 mmol/L (ref 3.5–5.1)
Sodium: 137 mmol/L (ref 135–145)
Total Bilirubin: 0.6 mg/dL (ref 0.3–1.2)
Total Protein: 7.8 g/dL (ref 6.5–8.1)

## 2022-12-12 LAB — CBC
HCT: 41.5 % (ref 36.0–46.0)
Hemoglobin: 13.9 g/dL (ref 12.0–15.0)
MCH: 30.5 pg (ref 26.0–34.0)
MCHC: 33.5 g/dL (ref 30.0–36.0)
MCV: 91.2 fL (ref 80.0–100.0)
Platelets: 391 10*3/uL (ref 150–400)
RBC: 4.55 MIL/uL (ref 3.87–5.11)
RDW: 13.2 % (ref 11.5–15.5)
WBC: 6.6 10*3/uL (ref 4.0–10.5)
nRBC: 0 % (ref 0.0–0.2)

## 2022-12-12 LAB — LIPASE, BLOOD: Lipase: 41 U/L (ref 11–51)

## 2022-12-12 LAB — TROPONIN I (HIGH SENSITIVITY)
Troponin I (High Sensitivity): 3 ng/L (ref <18)
Troponin I (High Sensitivity): 3 ng/L (ref <18)

## 2022-12-12 NOTE — ED Triage Notes (Signed)
Chest pain rads to left  arm since 1 am and then she felt a little better and then her abd hurt . Denies n/v/d  some sob

## 2022-12-12 NOTE — ED Notes (Signed)
Pt states her mom and dad had heart issues in their 60s and dad died at 20 with heart attack

## 2022-12-12 NOTE — ED Provider Notes (Signed)
[facility] Provider Note   CSN: [id number] Arrival date & time: 12/12/22  1962     History  Chief Complaint  Patient presents with   Chest Pain    Rachel Miller is a 52 y.o. female.  The history is provided by the patient.  Chest Pain Pain location:  L chest Pain quality: sharp   Pain radiates to:  L arm Pain severity:  Moderate Onset quality:  Sudden Duration:  20 minutes Timing:  Intermittent Progression:  Resolved Chronicity:  New Context: breathing, movement and raising an arm   Relieved by:  Rest Worsened by:  Movement Ineffective treatments:  None tried Associated symptoms: abdominal pain, anxiety and shortness of breath (with breathing)   Associated symptoms: no cough, no diaphoresis, no dizziness, no fatigue, no fever, no headache, no heartburn, no lower extremity edema, no nausea, no near-syncope, no numbness, no orthopnea, no palpitations, no syncope, no vomiting and no weakness        Home Medications Prior to Admission medications   Medication Sig Start Date End Date Taking? Authorizing Provider  cetirizine (ZYRTEC) 10 MG chewable tablet Chew 10 mg by mouth daily.    [provider]  Cholecalciferol (VITAMIN D PO) Take 1 tablet by mouth daily.    [provider]  Multiple Vitamins-Minerals (ZINC PO) Take 1 tablet by mouth daily.    [provider]      Allergies    Patient has no known allergies.    Review of Systems   Review of Systems  Constitutional:  Negative for diaphoresis, fatigue and fever.  Respiratory:  Positive for shortness of breath (with breathing). Negative for cough.   Cardiovascular:  Positive for chest pain. Negative for palpitations, orthopnea, syncope and near-syncope.  Gastrointestinal:  Positive for abdominal pain. Negative for heartburn, nausea and vomiting.  Neurological:  Negative for dizziness, weakness, numbness and headaches.    Physical  Exam Updated Vital Signs BP (!) 169/75   Pulse 65   Temp 97.7 F (36.5 C) (Oral)   Resp 14   Ht 4\' 3"  (1.295 m)   Wt 68 kg   SpO2 98%   BMI 40.55 kg/m  Physical Exam Vitals and nursing note reviewed.  Constitutional:      General: She is not in acute distress.    Appearance: She is well-developed. She is not diaphoretic.  HENT:     Head: Normocephalic and atraumatic.     Right Ear: External ear normal.     Left Ear: External ear normal.     Nose: Nose normal.     Mouth/Throat:     Mouth: Mucous membranes are moist.  Eyes:     General: No scleral icterus.    Conjunctiva/sclera: Conjunctivae normal.  Cardiovascular:     Rate and Rhythm: Normal rate and regular rhythm.     Heart sounds: Normal heart sounds. No murmur heard.    No friction rub. No gallop.  Pulmonary:     Effort: Pulmonary effort is normal. No respiratory distress.     Breath sounds: Normal breath sounds.  Chest:     Chest wall: Tenderness present.    Abdominal:     General: Bowel sounds are normal. There is no distension.     Palpations: Abdomen is soft. There is no mass.     Tenderness: There is no abdominal tenderness. There is no guarding.  Musculoskeletal:     Cervical back: Normal range of motion.  Skin:    General: Skin is warm and dry.  Neurological:     Mental Status: She is alert and oriented to person, place, and time.  Psychiatric:        Behavior: Behavior normal.     ED Results / Procedures / Treatments   Labs (all labs ordered are listed, but only abnormal results are displayed) Labs Reviewed  COMPREHENSIVE METABOLIC PANEL - Abnormal; Notable for the following components:      Result Value   Glucose, Bld 113 (*)    All other components within normal limits  CBC  LIPASE, BLOOD  TROPONIN I (HIGH SENSITIVITY)  TROPONIN I (HIGH SENSITIVITY)    EKG None  Radiology DG Chest 2 View  Result Date: 12/12/2022 CLINICAL DATA:  Chest pain radiating to left arm which began this  morning. Shortness of breath. EXAM: CHEST - 2 VIEW COMPARISON:  04/12/2015 FINDINGS: The heart size and mediastinal contours are within normal limits. Both lungs are clear. The visualized skeletal structures are unremarkable. IMPRESSION: No active cardiopulmonary disease. Electronically Signed   By: Danae Orleans M.D.   On: 12/12/2022 09:40    Procedures Procedures    Medications Ordered in ED Medications - No data to display  ED Course/ Medical Decision Making/ A&P Clinical Course as of 12/12/22 1859  Sun Dec 12, 2022  1055 Comprehensive metabolic panel(!) [AH]  1055 Glucose(!): 113 [AH]  1055 Troponin I (High Sensitivity) Mildly elevated glucose of insignificant value initial troponin within normal limits [AH]  1055 CBC [AH]  1055 Lipase, blood CBC and lipase unremarkable [AH]  1055 DG Chest 2 View I visualized and interpreted two-view chest x-ray which shows no acute findings including no cardiomegaly [AH]  1056 ED EKG ECG interpretation  Rate: 81  Rhythm: normal sinus rhythm  QRS Axis: normal  Intervals: normal  ST/T Wave abnormalities: normal  Conduction Disutrbances: none  Narrative Interpretation:     [AH]    Clinical Course User Index [AH] Arthor Captain, PA-C             HEART Score: 2                Medical Decision Making Given the large differential diagnosis for Rachel Miller, the decision making in this case is of high complexity.  After evaluating all of the data points in this case, the presentation of Rachel Miller is NOT consistent with Acute Coronary Syndrome (ACS) and/or myocardial ischemia, pulmonary embolism, aortic dissection; Borhaave's, significant arrythmia, pneumothorax, cardiac tamponade, or other emergent cardiopulmonary condition.  Further, the presentation of Rachel Miller is NOT consistent with pericarditis, myocarditis, cholecystitis, pancreatitis, mediastinitis, endocarditis, new valvular disease.  Additionally, the  presentation of Rachel Miller NOT consistent with flail chest, cardiac contusion, ARDS, or significant intra-thoracic or intra-abdominal bleeding.  Moreover, this presentation is NOT consistent with pneumonia, sepsis, or pyelonephritis.  The patient has a HEART Score: 2    Strict return and follow-up precautions have been given by me personally or by detailed written instruction given verbally by nursing staff using the teach back method to the patient/family/caregiver(s).  Data Reviewed/Counseling: I have reviewed the patient's vital signs, nursing notes, and other relevant tests/information. I had a detailed discussion regarding the historical points, exam findings, and any diagnostic results supporting the discharge diagnosis. I also discussed the need for outpatient follow-up and the need to return to the ED if symptoms worsen or if there are any questions or concerns that arise at home.  Amount and/or Complexity of Data Reviewed Labs: ordered. Decision-making details documented in ED Course. Radiology: ordered and independent interpretation performed. Decision-making details documented in ED Course. ECG/medicine tests: ordered and independent interpretation performed. Decision-making details documented in ED Course.  Risk Risk Details: Heart Score 2           Final Clinical Impression(s) / ED Diagnoses Final diagnoses:  Chest wall pain    Rx / DC Orders ED Discharge Orders     None         Arthor CaptainHarris, Edona Schreffler, PA-C 12/12/22 1859    Derwood KaplanNanavati, Ankit, MD 12/13/22 [id number]

## 2022-12-12 NOTE — Discharge Instructions (Signed)
You have been diagnosed by your caregiver as having chest wall pain. °SEEK IMMEDIATE MEDICAL ATTENTION IF: °You develop a fever.  °Your chest pains become severe or intolerable.  °You develop new, unexplained symptoms (problems).  °You develop shortness of breath, nausea, vomiting, sweating or feel light headed.  °You develop a new cough or you cough up blood. ° °

## 2023-01-16 NOTE — Progress Notes (Unsigned)
Cardiology Office Note   Date:  01/19/2023   ID:  Rachel Miller, DOB 1971/07/01, MRN [id number]  PCP:  Rick Duff, PA-C  Cardiologist:   None Referring:  Self  Chief Complaint  Patient presents with   Chest Pain      History of Present Illness: Rachel Miller is a 52 y.o. female who was in the ED in April 2024 with chest pain.  I reviewed these records for this visit.   She had no EKG changes and enzymes were negative.  She has woken up.  She had some left-sided sharp pain of 5 out of 10 in intensity.  1, left arm.  She was able to go to sleep and went away.  When she woke up in the morning she had some abdominal discomfort and she felt fatigued with her walk .  Otherwise she has not been having any new symptoms.  She runs regularly.  She walks regularly.    The patient denies any new symptoms such as chest discomfort, neck or arm discomfort. There has been no new shortness of breath, PND or orthopnea. There have been no reported palpitations, presyncope or syncope.    PMH:  None  Past Surgical History:  Procedure Laterality Date   CESAREAN SECTION  2005   CHOLECYSTECTOMY     NOVASURE ABLATION  11/2005   TUBAL LIGATION  2005     Current Outpatient Medications  Medication Sig Dispense Refill   B Complex Vitamins (B COMPLEX PO) Take 1 tablet by mouth daily in the afternoon.     cetirizine (ZYRTEC) 10 MG chewable tablet Chew 10 mg by mouth daily.     Cholecalciferol (VITAMIN D PO) Take 1 tablet by mouth daily.     famotidine (PEPCID) 20 MG tablet Take 20 mg by mouth daily.     GARLIC PO Take 1 tablet by mouth daily in the afternoon.     Glucosamine-Chondroit-Vit C-Mn (GLUCOSAMINE-CHONDROITIN) TABS Take 1 tablet by mouth daily in the afternoon.     Multiple Vitamins-Minerals (ZINC PO) Take 1 tablet by mouth daily.     No current facility-administered medications for this visit.    Allergies:   Patient has no known allergies.    Social History:  The  patient  reports that she has never smoked. She has never used smokeless tobacco. She reports current alcohol use. She reports that she does not use drugs.   Family History:  The patient's family history includes Heart attack in her father; Heart attack (age of onset: 87) in her mother; Heart disease (age of onset: 70) in her father; Ovarian cancer in her maternal aunt.    ROS:  Please see the history of present illness.   Otherwise, review of systems are positive for none.   All other systems are reviewed and negative.    PHYSICAL EXAM: VS:  BP (!) 152/90 (BP Location: Left Arm, Patient Position: Sitting, Cuff Size: Normal)   Pulse (!) 59   Ht 5\' 3"  (1.6 m)   Wt 155 lb 9.6 oz (70.6 kg)   SpO2 97%   BMI 27.56 kg/m  , BMI Body mass index is 27.56 kg/m. GENERAL:  Well appearing HEENT:  Pupils equal round and reactive, fundi not visualized, oral mucosa unremarkable NECK:  No jugular venous distention, waveform within normal limits, carotid upstroke brisk and symmetric, no bruits, no thyromegaly LYMPHATICS:  No cervical, inguinal adenopathy LUNGS:  Clear to auscultation bilaterally BACK:  No CVA tenderness  CHEST:  Unremarkable HEART:  PMI not displaced or sustained,S1 and S2 within normal limits, no S3, no S4, no clicks, no rubs, no murmurs ABD:  Flat, positive bowel sounds normal in frequency in pitch, no bruits, no rebound, no guarding, no midline pulsatile mass, no hepatomegaly, no splenomegaly EXT:  2 plus pulses throughout, no edema, no cyanosis no clubbing SKIN:  No rashes no nodules NEURO:  Cranial nerves II through XII grossly intact, motor grossly intact throughout PSYCH:  Cognitively intact, oriented to person place and time    EKG:  EKG is ordered today. The ekg ordered today demonstrates sinus rhythm, rate 59, axis within normal limits, intervals within normal limits, no acute ST-T wave changes.   Recent Labs: 12/12/2022: ALT 17; BUN 15; Creatinine, Ser 0.64; Hemoglobin  13.9; Platelets 391; Potassium 3.9; Sodium 137    Lipid Panel    Component Value Date/Time   CHOL 161 07/18/2019 1157   TRIG 114 07/18/2019 1157   HDL 62 07/18/2019 1157   CHOLHDL 2.6 07/18/2019 1157   VLDL 13 05/13/2016 1622   LDLCALC 79 07/18/2019 1157      Wt Readings from Last 3 Encounters:  01/19/23 155 lb 9.6 oz (70.6 kg)  12/12/22 150 lb (68 kg)  07/18/19 170 lb (77.1 kg)      Other studies Reviewed: Additional studies/ records that were reviewed today include: ED records. Review of the above records demonstrates:  Please see elsewhere in the note.     ASSESSMENT AND PLAN:  Chest pain: Her chest pain has nonanginal greater than anginal features.  There was no objective evidence of ischemia.  She is not having any longer and she is having very high physical activity level.  I do not think this represents obstructive coronary disease.  Given her family history I would like to screen her with a coronary calcium score.  I also will check an NMR and LP(a) for primary risk reduction.  We talked about a plant-based diet and I applauded her for her excellent exercise regimen.   Current medicines are reviewed at length with the patient today.  The patient does not have concerns regarding medicines.  The following changes have been made:  no change  Labs/ tests ordered today include:   Orders Placed This Encounter  Procedures   CT CARDIAC SCORING   NMR, lipoprofile   Lipoprotein A (LPA)   EKG 12-Lead     Disposition:   FU with me as needed.      Signed, Rollene Rotunda, MD  01/19/2023 11:52 AM    [facility]

## 2023-01-19 ENCOUNTER — Ambulatory Visit: Payer: BC Managed Care – PPO | Attending: Cardiology | Admitting: Cardiology

## 2023-01-19 ENCOUNTER — Encounter: Payer: Self-pay | Admitting: Cardiology

## 2023-01-19 VITALS — BP 152/90 | HR 59 | Ht 63.0 in | Wt 155.6 lb

## 2023-01-19 DIAGNOSIS — R072 Precordial pain: Secondary | ICD-10-CM | POA: Diagnosis not present

## 2023-01-19 NOTE — Patient Instructions (Addendum)
Medication Instructions:  Continue same medications   Lab Work: NMR Lipoprofile, LPa  Lab order enclosed   Testing/Procedures: Coronary Calcium Score   Follow-Up: At [facility], you and your health needs are our priority.  As part of our continuing mission to provide you with exceptional heart care, we have created designated Provider Care Teams.  These Care Teams include your primary Cardiologist (physician) and Advanced Practice Providers (APPs -  Physician Assistants and Nurse Practitioners) who all work together to provide you with the care you need, when you need it.  We recommend signing up for the patient portal called "MyChart".  Sign up information is provided on this After Visit Summary.  MyChart is used to connect with patients for Virtual Visits (Telemedicine).  Patients are able to view lab/test results, encounter notes, upcoming appointments, etc.  Non-urgent messages can be sent to your provider as well.   To learn more about what you can do with MyChart, go to [email or web address].    Your next appointment:  As Needed    Provider:  Dr.[email or web address]

## 2023-02-04 LAB — NMR, LIPOPROFILE
Cholesterol, Total: 177 mg/dL (ref 100–199)
HDL Particle Number: 51.7 umol/L (ref >=30.5)
HDL-C: 72 mg/dL (ref >39)
LDL Particle Number: 1043 nmol/L — ABNORMAL HIGH (ref <1000)
LDL Size: 21.1 nm (ref >20.5)
LDL-C (NIH Calc): 94 mg/dL (ref 0–99)
LP-IR Score: 45 (ref <=45)
Small LDL Particle Number: 418 nmol/L (ref <=527)
Triglycerides: 56 mg/dL (ref 0–149)

## 2023-02-04 LAB — LIPOPROTEIN A (LPA): Lipoprotein (a): 28.5 nmol/L (ref <75.0)

## 2023-02-14 ENCOUNTER — Ambulatory Visit (HOSPITAL_COMMUNITY)
Admission: RE | Admit: 2023-02-14 | Discharge: 2023-02-14 | Disposition: A | Discharge status: Home / Self Care | Payer: BC Managed Care – PPO | Source: Ambulatory Visit | Attending: Cardiology | Admitting: Cardiology

## 2023-02-14 DIAGNOSIS — R072 Precordial pain: Secondary | ICD-10-CM | POA: Insufficient documentation

## 2023-07-27 ENCOUNTER — Other Ambulatory Visit: Payer: Self-pay | Admitting: Medical Genetics

## 2023-07-27 DIAGNOSIS — Z006 Encounter for examination for normal comparison and control in clinical research program: Secondary | ICD-10-CM

## 2023-08-24 ENCOUNTER — Other Ambulatory Visit (HOSPITAL_COMMUNITY): Payer: Self-pay

## 2023-09-16 ENCOUNTER — Other Ambulatory Visit (HOSPITAL_COMMUNITY)
Admission: RE | Admit: 2023-09-16 | Discharge: 2023-09-16 | Disposition: A | Discharge status: Home / Self Care | Payer: Self-pay | Source: Ambulatory Visit | Attending: Medical Genetics | Admitting: Medical Genetics

## 2023-09-16 DIAGNOSIS — Z006 Encounter for examination for normal comparison and control in clinical research program: Secondary | ICD-10-CM | POA: Insufficient documentation

## 2023-09-27 LAB — GENECONNECT MOLECULAR SCREEN: Genetic Analysis Overall Interpretation: NEGATIVE
